# Patient Record
Sex: Male | Born: 1970 | Race: White | Hispanic: No | Marital: Married | Smoking: Never smoker
Health system: Southern US, Community
[De-identification: ages and names within clinical notes are randomized; demographics above are authoritative.]

---

## 2008-04-19 ENCOUNTER — Ambulatory Visit: Payer: Self-pay | Admitting: Family Medicine

## 2008-04-19 DIAGNOSIS — M25819 Other specified joint disorders, unspecified shoulder: Secondary | ICD-10-CM | POA: Insufficient documentation

## 2008-04-19 DIAGNOSIS — M7580 Other shoulder lesions, unspecified shoulder: Secondary | ICD-10-CM

## 2008-04-20 LAB — CONVERTED CEMR LAB
LDL Cholesterol: 136 mg/dL — ABNORMAL HIGH (ref 0–99)
Total CHOL/HDL Ratio: 6.4
Triglycerides: 153 mg/dL — ABNORMAL HIGH (ref 0–149)

## 2009-08-03 ENCOUNTER — Ambulatory Visit: Payer: Self-pay | Admitting: Family Medicine

## 2009-08-03 DIAGNOSIS — J069 Acute upper respiratory infection, unspecified: Secondary | ICD-10-CM | POA: Insufficient documentation

## 2009-09-13 ENCOUNTER — Encounter: Payer: Self-pay | Admitting: Family Medicine

## 2009-09-13 ENCOUNTER — Ambulatory Visit: Payer: Self-pay | Admitting: Family Medicine

## 2009-09-13 DIAGNOSIS — J020 Streptococcal pharyngitis: Secondary | ICD-10-CM | POA: Insufficient documentation

## 2010-04-27 ENCOUNTER — Ambulatory Visit: Payer: Self-pay | Admitting: Family Medicine

## 2010-04-27 DIAGNOSIS — H6980 Other specified disorders of Eustachian tube, unspecified ear: Secondary | ICD-10-CM | POA: Insufficient documentation

## 2010-08-29 NOTE — Consult Note (Signed)
Summary: [facility]  [facility]   Imported By: Lanelle Bal 10/05/2009 08:14:11  _____________________________________________________________________  External Attachment:    Type:   Image     Comment:   External Document

## 2010-08-29 NOTE — Assessment & Plan Note (Signed)
Summary: SORE THROAT   Vital Signs:  Patient profile:   40 year old male Height:      73 inches Weight:      192.2 pounds BMI:     25.45 Temp:     98.5 degrees F oral Pulse rate:   80 / minute Pulse rhythm:   regular BP sitting:   110 / 80  (left arm) Cuff size:   large  Vitals Entered By: Benny Lennert CMA Duncan Dull) (September 13, 2009 10:19 AM)  History of Present Illness: Chief complaint patient here for sore throat seen at urgent care on saturday and diagnosed with strep given Penicillin left side of throat is better but right side seems to be worse.  Continuing fever, worsening pain, difficulty swallowing. no difficulty breathing.   Acute Visit History:      The patient complains of earache, fever, and sore throat.  These symptoms began 5 days ago.  He denies cough, nasal discharge, and sinus problems.  Other comments include: Seen at urgent Care.Marland Kitchendx with strep..pos test. Given penicillin VK two times a day ..on 4th day dose Minimal improvement.  Right thoat worse now, fever continuing.  Using motrin for pain.        His highest temperature has been 101.  This temperature was recorded last evening.        The earache is located on the right side.        Problems Prior to Update: 1)  ? of Abscess, Peritonsillar, Right  (ICD-475) 2)  Pharyngitis, Streptococcal  (ICD-034.0) 3)  Uri  (ICD-465.9) 4)  Shoulder Impingement Syndrome, Right  (ICD-726.2) 5)  Screening For Lipoid Disorders  (ICD-V77.91) 6)  Family History of Alcoholism/addiction  (ICD-V61.41)  Current Medications (verified): 1)  None  Allergies (verified): No Known Drug Allergies  Past History:  Past medical, surgical, family and social histories (including risk factors) reviewed, and no changes noted (except as noted below).  Past Medical History: Reviewed history from 04/19/2008 and no changes required. none  Past Surgical History: Reviewed history from 04/19/2008 and no changes  required. none  Family History: Reviewed history from 04/19/2008 and no changes required. Family History Hypertension Parents, CAD Family History of Alcoholism/Addiction GP, Colon CA  Father, 54 onset of CABGx4, CAD, HTN, lipids Gradfather, 72, d/c MI Mom, melanoma of eye  Social History: Reviewed history from 04/19/2008 and no changes required. 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 CV:  Denies chest pain or discomfort and swelling of feet. Resp:  Denies shortness of breath. GI:  Denies abdominal pain. GU:  Denies dysuria.  Physical Exam  General:  Well-developed,well-nourished,in no acute distress; alert,appropriate and cooperative throughout examination Ears:  clear fluid B TMS Nose:  External nasal examination shows no deformity or inflammation. Nasal mucosa are pink and moist without lesions or exudates. Mouth:  good dentition, pharyngeal erythema, and pharyngeal exudate on right, also swelling uneual..right tonsil protruding toward uvula, uvula central. Airway clear, non obstructed.    Impression & Recommendations:  Problem # 1:  PHARYNGITIS, STREPTOCOCCAL (ICD-034.0) Continued worseing of symtpoms and fever on penicillin. Will give IM rocephin and add clindamycin to broaden antibiotics to cover anerobes. Will refer to ENT ASAP for assessment for need of drainage vs watchful waiting. Pt in agreement.  The following medications were removed from the medication list:    Zithromax Z-pak 250 Mg Tabs (Azithromycin) ..... Use  as directed His updated medication list for this problem includes:    Clindamycin Hcl 300 Mg Caps (Clindamycin hcl) .Marland Kitchen... 1 tab by mouth three times a day x 10 days  Orders: ENT Referral (ENT) Rocephin  250mg  (U7253)  Problem # 2:  ? of ABSCESS, PERITONSILLAR, RIGHT (ICD-475) Concern for this as stated above.  No airway  obstruction.Verlon Monks instructed if SOB.Marland Kitchento go to ER.  Orders: ENT Referral (ENT) Admin of Therapeutic Inj  intramuscular or subcutaneous (66440)  Complete Medication List: 1)  Clindamycin Hcl 300 Mg Caps (Clindamycin hcl) .Marland Kitchen.. 1 tab by mouth three times a day x 10 days  Patient Instructions: 1)  Referral Appointment Information 2)  Day/Date: 3)  Time: 4)  Place/MD: 5)  Address: 6)  Phone/Fax: 7)  Patient given appointment information. Information/Orders faxed/mailed.  8)  GO to ER if SOB.  Prescriptions: CLINDAMYCIN HCL 300 MG CAPS (CLINDAMYCIN HCL) 1 tab by mouth three times a day x 10 days  #30 x 0   Entered and Authorized by:   Kerby Nora MD   Signed by:   Kerby Nora MD on 09/13/2009   Method used:   [id number]       [address]       [place], [place]  [address]       Ph: [phone] or [phone]       Fax: [phone]   RxID:   [id number]   Current Allergies (reviewed today): No known allergies       Medication Administration  Injection # 1:    Medication: Rocephin  250mg     Diagnosis: PHARYNGITIS, STREPTOCOCCAL (ICD-034.0)    Route: IM    Site: RUOQ gluteus    Exp Date: 10-2011    Lot #: [id number]    Mfr: Novartis    Comments: 1 g IM x 1     Patient tolerated injection without complications    Given by: Benny Lennert CMA Duncan Dull) (September 13, 2009 11:12 AM)  Orders Added: 1)  Est. Patient Level IV [15176] 2)  ENT Referral [ENT] 3)  Rocephin  250mg  [J0696] 4)  Admin of Therapeutic Inj  intramuscular or subcutaneous [96372]   Medication Administration  Injection # 1:    Medication: Rocephin  250mg     Diagnosis: PHARYNGITIS, STREPTOCOCCAL (ICD-034.0)    Route: IM    Site: RUOQ gluteus    Exp Date: 10-2011    Lot #: [id number]    Mfr: Novartis    Comments: 1 g IM x 1     Patient tolerated injection without complications    Given by: Benny Lennert CMA Duncan Dull) (September 13, 2009 11:12  AM)  Orders Added: 1)  Est. Patient Level IV [10626] 2)  ENT Referral [ENT] 3)  Rocephin  250mg  [J0696] 4)  Admin of Therapeutic Inj  intramuscular or subcutaneous [94854]

## 2010-08-29 NOTE — Assessment & Plan Note (Signed)
Summary: EAR/CLE   Vital Signs:  Patient profile:   40 year old male Height:      73 inches Weight:      205.75 pounds BMI:     27.24 Temp:     98.2 degrees F oral Pulse rate:   92 / minute Pulse rhythm:   regular BP sitting:   122 / 70  (left arm) Cuff size:   large  Vitals Entered By: Delilah Shan CMA Duncan Dull) (April 27, 2010 3:51 PM) CC: Ear   History of Present Illness: R ear always feels "infected".  Often with pressure and pain in R ear.  Dec in hearing in R ear today.  H/o R ear infections.  No FCNAV.  No rhinorrhea, no cough.  No tinnitus.   No h/o ear surgery.    Allergies: No Known Drug Allergies  Review of Systems       See HPI.  Otherwise negative.    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  Still with intact air>bone hearing and no indication for antibiotics now.  I would use samples of veramyst in meantime and call back if not improved so we can set him up with [facility].  He understood.   Patient Instructions: 1)  use the veramyst samples- 2 sprays in each nostril once a day (total of 4 sprays a day).  Use it for 2 weeks and let me know if your aren't getting better.  Call me and we can set up an ENT visit.   Prior Medications (reviewed today): None Current Allergies (reviewed today): No known allergies

## 2010-08-29 NOTE — Assessment & Plan Note (Signed)
Summary: sinus infection   Vital Signs:  Patient profile:   40 year old male Height:      73 inches Weight:      200.25 pounds BMI:     26.52 Temp:     97.9 degrees F oral Pulse rate:   80 / minute Pulse rhythm:   regular BP sitting:   110 / 78  (left arm) Cuff size:   regular  Vitals Entered By: Lewanda Rife LPN (August 03, 2009 11:33 AM)  CC:  sinus infection, sinus drainage, head congested, and h/a and both earsache.  History of Present Illness: Here for head congestion--onset x 2wks--fever at onset, some worse now than 4-5d ago --nothing helping --taking mucinex, dayquil and nyquil --27 mo old was sick several wks ago--just prior to onset  Allergies (verified): No Known Drug Allergies  Review of Systems      See HPI  Physical Exam  General:  alert, well-developed, well-nourished, and well-hydrated.  NAD Ears:  TMs retracted woth increased fluids bilat Nose:  no mucosal edema, no airflow obstruction, and mucosal erythema.  sinuses +,- Mouth:  no exudates and pharyngeal erythema.   Lungs:  moist couth, no crackles and no wheezes.   Neurologic:  alert & oriented X3, sensation intact to light touch, and gait normal.   Cervical Nodes:  no anterior cervical adenopathy and no posterior cervical adenopathy.   Psych:  normally interactive, flat affect, and subdued.     Impression & Recommendations:  Problem # 1:  URI (ICD-465.9) Assessment New continue comfort care measures: increase po fluids, rest, tylenol or IBP as needed will start on zpac see back in 7-10d if not improved His updated medication list for this problem includes:    Mucinex D 60-600 Mg Xr12h-tab (Pseudoephedrine-guaifenesin) ..... Otc as directed.    Nyquil 60-7.12-26-998 Mg/11ml Liqd (Pseudoeph-doxylamine-dm-apap) ..... Otc as directed.    Dayquil Multi-symptom 30-325-10 Mg/36ml Liqd (Pseudoephedrine-apap-dm) ..... Otc as directed.  Complete Medication List: 1)  Mucinex D 60-600 Mg Xr12h-tab  (Pseudoephedrine-guaifenesin) .... Otc as directed. 2)  Nyquil 60-7.12-26-998 Mg/18ml Liqd (Pseudoeph-doxylamine-dm-apap) .... Otc as directed. 3)  Dayquil Multi-symptom 30-325-10 Mg/17ml Liqd (Pseudoephedrine-apap-dm) .... Otc as directed. 4)  Zithromax Z-pak 250 Mg Tabs (Azithromycin) .... Use as directed Prescriptions: ZITHROMAX Z-PAK 250 MG TABS (AZITHROMYCIN) use as directed  #1 x 0   Entered and Authorized by:   Gildardo Griffes FNP   Signed by:   Gildardo Griffes FNP on 08/03/2009   Method used:   [facility]        [facility]       [address]       [place], [place]  [address]       Ph: [phone] or [phone]       Fax: [phone]   RxID:   [id number]   Current Allergies (reviewed today): No known allergies

## 2013-05-04 ENCOUNTER — Other Ambulatory Visit: Payer: Self-pay | Admitting: Family Medicine

## 2013-05-04 DIAGNOSIS — Z13220 Encounter for screening for lipoid disorders: Secondary | ICD-10-CM

## 2013-05-04 DIAGNOSIS — Z131 Encounter for screening for diabetes mellitus: Secondary | ICD-10-CM

## 2013-05-07 ENCOUNTER — Other Ambulatory Visit (INDEPENDENT_AMBULATORY_CARE_PROVIDER_SITE_OTHER): Payer: 59

## 2013-05-07 DIAGNOSIS — Z13220 Encounter for screening for lipoid disorders: Secondary | ICD-10-CM

## 2013-05-07 DIAGNOSIS — Z131 Encounter for screening for diabetes mellitus: Secondary | ICD-10-CM

## 2013-05-07 DIAGNOSIS — R7989 Other specified abnormal findings of blood chemistry: Secondary | ICD-10-CM

## 2013-05-07 LAB — BASIC METABOLIC PANEL
BUN: 13 mg/dL (ref 6–23)
GFR: 60.83 mL/min (ref >60.00)
Glucose, Bld: 94 mg/dL (ref 70–99)
Potassium: 4.2 mEq/L (ref 3.5–5.1)

## 2013-05-07 LAB — LIPID PANEL: HDL: 33.9 mg/dL — ABNORMAL LOW (ref >39.00)

## 2013-05-13 ENCOUNTER — Encounter: Payer: Self-pay | Admitting: Family Medicine

## 2013-05-13 ENCOUNTER — Ambulatory Visit (INDEPENDENT_AMBULATORY_CARE_PROVIDER_SITE_OTHER): Payer: 59 | Admitting: Family Medicine

## 2013-05-13 VITALS — BP 110/80 | HR 92 | Temp 98.1°F | Ht 73.5 in | Wt 211.2 lb

## 2013-05-13 DIAGNOSIS — Z0000 Encounter for general adult medical examination without abnormal findings: Secondary | ICD-10-CM

## 2013-05-13 DIAGNOSIS — Z23 Encounter for immunization: Secondary | ICD-10-CM

## 2013-05-13 DIAGNOSIS — Z800 Family history of malignant neoplasm of digestive organs: Secondary | ICD-10-CM

## 2013-05-13 DIAGNOSIS — Z1211 Encounter for screening for malignant neoplasm of colon: Secondary | ICD-10-CM

## 2013-05-13 NOTE — Progress Notes (Signed)
Date:  05/13/2013   Name:  Ethan Jones   DOB:  [birth year]   MRN:  [id number] Gender: male Age: 42 y.o.  Primary Physician:  Hannah Beat, MD   Chief Complaint: Annual Exam   History of Present Illness:  Ethan Jones is a 42 y.o. pleasant patient who presents with the following:  CPX:  Grandmother has colon cancer Sister when 62 and found polyps on colon - adenomatous polyps  Has a 41 year old and had a varicocoele surgery earlier in the year.   Preventative Health Maintenance Visit:  Health Maintenance Summary Reviewed and updated, unless pt declines services.  Tobacco History Reviewed. Alcohol: No concerns, no excessive use Exercise Habits: Some activity, rec at least 30 mins 5 times a week STD concerns: no risk or activity to increase risk Drug Use: None Encouraged self-testicular check  Health Maintenance  Topic Date Due  . Tetanus/tdap  03/26/1991  . Influenza Vaccine  02/27/2014    Labs reviewed with the patient.  Results for orders placed in visit on 05/07/13  LIPID PANEL      Result Value Range   Cholesterol 181  0 - 200 mg/dL   Triglycerides 409.8 (*) 0.0 - 149.0 mg/dL   HDL 11.91 (*) >47.82 mg/dL   VLDL 95.6 (*) 0.0 - 21.3 mg/dL   Total CHOL/HDL Ratio 5    BASIC METABOLIC PANEL      Result Value Range   Sodium 143  135 - 145 mEq/L   Potassium 4.2  3.5 - 5.1 mEq/L   Chloride 106  96 - 112 mEq/L   CO2 30  19 - 32 mEq/L   Glucose, Bld 94  70 - 99 mg/dL   BUN 13  6 - 23 mg/dL   Creatinine, Ser 1.4  0.4 - 1.5 mg/dL   Calcium 9.3  8.4 - 08.6 mg/dL   GFR 57.84  >69.62 mL/min  LDL CHOLESTEROL, DIRECT      Result Value Range   Direct LDL 112.4       Patient Active Problem List   Diagnosis Date Noted  . 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  No past surgical history on file.  History   Social History  . Marital Status: Married    Spouse Name: N/A    Number of Children: N/A  . Years of  Education: N/A   Occupational History  . Not on file.   Social History Main Topics  . Smoking status: Never Smoker   . Smokeless tobacco: Never Used  . Alcohol Use: No  . Drug Use: No  . Sexual Activity: Not on file   Other Topics Concern  . Not on file   Social History Narrative  . No narrative on file    No family history on file.  No Known Allergies  Medication list has been reviewed and updated.  No outpatient prescriptions prior to visit.   No facility-administered medications prior to visit.    Review of Systems:   General: Denies fever, chills, sweats. No significant weight loss. Eyes: Denies blurring,significant itching ENT: Denies earache, sore throat, and hoarseness. Cardiovascular: Denies chest pains, palpitations, dyspnea on exertion Respiratory: Denies cough, dyspnea at rest,wheeezing Breast: no concerns about lumps GI: Denies nausea, vomiting, diarrhea, constipation, change in bowel habits, abdominal pain, melena, hematochezia GU: Denies penile discharge, ED, urinary flow / outflow problems. No STD concerns. - VARICOCOELE SURGERY Musculoskeletal: Denies back pain, joint pain Derm: Denies rash,  itching Neuro: Denies  paresthesias, frequent falls, frequent headaches Psych: Denies depression, anxiety Endocrine: Denies cold intolerance, heat intolerance, polydipsia Heme: Denies enlarged lymph nodes Allergy: No hayfever   Physical Examination: BP 110/80  Pulse 92  Temp(Src) 98.1 F (36.7 C) (Oral)  Ht 6' 1.5" (1.867 m)  Wt 211 lb 4 oz (95.822 kg)  BMI 27.49 kg/m2  Ideal Body Weight: Weight in (lb) to have BMI = 25: 191.7  GEN: well developed, well nourished, no acute distress Eyes: conjunctiva and lids normal, PERRLA, EOMI ENT: TM clear, nares clear, oral exam WNL Neck: supple, no lymphadenopathy, no thyromegaly, no JVD Pulm: clear to auscultation and percussion, respiratory effort normal CV: regular rate and rhythm, S1-S2, no murmur, rub or  gallop, no bruits, peripheral pulses normal and symmetric, no cyanosis, clubbing, edema or varicosities GI: soft, non-tender; no hepatosplenomegaly, masses; active bowel sounds all quadrants GU: no hernia, testicular mass, penile discharge Lymph: no cervical, axillary or inguinal adenopathy MSK: gait normal, muscle tone and strength WNL, no joint swelling, effusions, discoloration, crepitus  SKIN: clear, good turgor, color WNL, no rashes, lesions, or ulcerations Neuro: normal mental status, normal strength, sensation, and motion Psych: alert; oriented to person, place and time, normally interactive and not anxious or depressed in appearance.   Assessment and Plan:  Routine general medical examination at a health care facility  Family history of colon cancer requiring screening colonoscopy - Plan: Ambulatory referral to Gastroenterology  Special screening for malignant neoplasms, colon - Plan: Ambulatory referral to Gastroenterology  Need for prophylactic vaccination with combined diphtheria-tetanus-pertussis (DTP) vaccine - Plan: Tdap vaccine greater than or equal to 7yo IM  The patient's preventative maintenance and recommended screening tests for an annual wellness exam were reviewed in full today. Brought up to date unless services declined.  Counselled on the importance of diet, exercise, and its role in overall health and mortality. The patient's FH and SH was reviewed, including their home life, tobacco status, and drug and alcohol status.   The patient's sister had a high-grade adenomatous polyp at age of 60, and he also has a grandmother on the maternal side who had colon cancer. Following the recommendations from the [facility] of gastroenterology, I have referred him for colonoscopy given his increased risk.  Orders Today:  Orders Placed This Encounter  Procedures  . Tdap vaccine greater than or equal to 7yo IM  . Ambulatory referral to Gastroenterology    Referral  Priority:  Routine    Referral Type:  Consultation    Referral Reason:  Specialty Services Required    Requested Specialty:  Gastroenterology    Number of Visits Requested:  1    Updated Medication List: (Includes new medications, updates to list, dose adjustments) No orders of the defined types were placed in this encounter.    Medications Discontinued: There are no discontinued medications.    Signed,  Elpidio Galea. Rozalyn Osland, MD, CAQ Sports Medicine  [facility] [address] [place] [address] Phone: [phone] Fax: [phone]

## 2013-05-13 NOTE — Patient Instructions (Signed)
REFERRAL: GO THE THE FRONT ROOM AT THE ENTRANCE OF OUR CLINIC, NEAR CHECK IN. ASK FOR Ethan Jones. SHE WILL HELP YOU SET UP YOUR REFERRAL. DATE: TIME:  

## 2013-06-08 ENCOUNTER — Encounter: Payer: Self-pay | Admitting: Internal Medicine

## 2013-07-07 ENCOUNTER — Ambulatory Visit: Payer: 59 | Admitting: Internal Medicine

## 2013-08-06 ENCOUNTER — Telehealth: Payer: Self-pay

## 2013-08-06 MED ORDER — OSELTAMIVIR PHOSPHATE 75 MG PO CAPS: 75.0000 mg | ORAL_CAPSULE | Freq: Every day | ORAL | Status: DC

## 2013-08-06 NOTE — Telephone Encounter (Signed)
Left message on Jennifer's cell phone, that prescription for Ines BloomerShawn [facility] [place]

## 2013-08-06 NOTE — Telephone Encounter (Signed)
Tamiflu 75 mg, 1 po daily, #10, 0 refills

## 2013-08-06 NOTE — Telephone Encounter (Signed)
pts son was diagnosed with Flu A this afternoon and pt request preventative rx of tamiflu to [facility] Ethan Jones. Pt has no symptoms at this time. Pts son developed fever last night. Please advise. Pt request cb. 

## 2013-12-24 ENCOUNTER — Telehealth: Payer: Self-pay | Admitting: Family Medicine

## 2013-12-24 NOTE — Telephone Encounter (Signed)
Pt's wife dropped off Medical eval of Social Services form for completion.  She will be returning on 12/29/13 and can pick them up if completed. [phone]

## 2013-12-28 NOTE — Telephone Encounter (Signed)
done

## 2013-12-28 NOTE — Telephone Encounter (Signed)
Form completed and placed up front to be picked up.  Left message for Ethan Jones that form was ready.

## 2013-12-29 DIAGNOSIS — Z0279 Encounter for issue of other medical certificate: Secondary | ICD-10-CM

## 2014-02-12 ENCOUNTER — Ambulatory Visit (INDEPENDENT_AMBULATORY_CARE_PROVIDER_SITE_OTHER): Payer: 59 | Admitting: Family Medicine

## 2014-02-12 ENCOUNTER — Encounter: Payer: Self-pay | Admitting: Family Medicine

## 2014-02-12 VITALS — BP 96/70 | HR 75 | Temp 98.0°F | Ht 73.5 in | Wt 205.5 lb

## 2014-02-12 DIAGNOSIS — L739 Follicular disorder, unspecified: Secondary | ICD-10-CM | POA: Insufficient documentation

## 2014-02-12 DIAGNOSIS — L738 Other specified follicular disorders: Secondary | ICD-10-CM

## 2014-02-12 MED ORDER — DOXYCYCLINE HYCLATE 100 MG PO TABS: 100.0000 mg | ORAL_TABLET | Freq: Two times a day (BID) | ORAL | Status: DC

## 2014-02-12 NOTE — Patient Instructions (Signed)
Start antibiotic for 14 days. Wear sunscreen when out in sun. Start antibacterial soap on skin and edge of scalp.  Call if rash returns after being off antibiotics.

## 2014-02-12 NOTE — Progress Notes (Signed)
   Subjective:    Patient ID: Ethan Jones, male    DOB: September 15, 1970, 43 y.o.   MRN: [id number]  HPI  43 year old male pt of Dr. Patsy Lageropland presents with new onset multiple lesions on scalp in last 4 months. 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 No treatment.  The rash has spread since then, covers entire scalp. No other locations. Lesions are painful, pustules. Not itchy.  He notes they come and go as new ones pop up.  No fever.  He has been using head and shoulders shampoo without benefit. Has not applied anything else.  No hx of exposure or personal MRSA or boils.    Review of Systems  Constitutional: Negative for fever and fatigue.  HENT: Negative for ear pain.   Eyes: Negative for pain.  Respiratory: Negative for shortness of breath.   Cardiovascular: Negative for chest pain.       Objective:   Physical Exam  Constitutional: He appears well-developed and well-nourished.  HENT:  Head: Normocephalic.  Mouth/Throat: Oropharynx is clear and moist.  Eyes: Conjunctivae are normal. Pupils are equal, round, and reactive to light.  Neck: Normal range of motion. Neck supple.  Cardiovascular: Normal rate and regular rhythm.   Pulmonary/Chest: Effort normal and breath sounds normal. He has no wheezes.  Skin:  Pustules on scalp mainly at lower neck where hair is cut short          Assessment & Plan:

## 2014-02-12 NOTE — Assessment & Plan Note (Signed)
No large pustular lesions to culture. Treat with antibiotics.. Amy need [prolongued course.

## 2014-02-12 NOTE — Progress Notes (Signed)
Pre visit review using our clinic review tool, if applicable. No additional management support is needed unless otherwise documented below in the visit note. 

## 2014-04-12 ENCOUNTER — Ambulatory Visit: Payer: Self-pay

## 2014-06-16 ENCOUNTER — Encounter: Payer: Self-pay | Admitting: Family Medicine

## 2014-06-16 ENCOUNTER — Ambulatory Visit (INDEPENDENT_AMBULATORY_CARE_PROVIDER_SITE_OTHER): Payer: BC Managed Care – PPO | Admitting: Family Medicine

## 2014-06-16 VITALS — BP 118/78 | HR 127 | Temp 99.5°F | Wt 202.2 lb

## 2014-06-16 DIAGNOSIS — R509 Fever, unspecified: Secondary | ICD-10-CM

## 2014-06-16 LAB — POCT INFLUENZA A/B
INFLUENZA A, POC: NEGATIVE
INFLUENZA B, POC: NEGATIVE

## 2014-06-16 MED ORDER — ONDANSETRON 4 MG PO TBDP
4.0000 mg | ORAL_TABLET | Freq: Once | ORAL | Status: AC
  Administered 2014-06-16: 4 mg via ORAL

## 2014-06-16 MED ORDER — ONDANSETRON HCL 4 MG PO TABS: 4.0000 mg | ORAL_TABLET | Freq: Three times a day (TID) | ORAL | Status: DC | PRN

## 2014-06-16 MED ORDER — OSELTAMIVIR PHOSPHATE 75 MG PO CAPS: 75.0000 mg | ORAL_CAPSULE | Freq: Two times a day (BID) | ORAL | Status: AC

## 2014-06-16 NOTE — Progress Notes (Signed)
SUBJECTIVE:  Ethan Jones is a 43 y.o. male who present complaining of flu-like symptoms: fevers, chills, myalgias, congestion, sore throat and vomiting for 1 day. Denies dyspnea or wheezing.  Did not receive his flu shot yet this year.  + sick contacts- two new foster kids in their home, recently with URI symptoms.  No current outpatient prescriptions on file prior to visit.   No current facility-administered medications on file prior to visit.    No Known Allergies  No past medical history on file.  No past surgical history on file.  No family history on file.  History   Social History  . Marital Status: Married    Spouse Name: N/A    Number of Children: N/A  . Years of Education: N/A   Occupational History  . Not on file.   Social History Main Topics  . Smoking status: Never Smoker   . Smokeless tobacco: Never Used  . Alcohol Use: No  . Drug Use: No  . Sexual Activity: Not on file   Other Topics Concern  . Not on file   Social History Narrative   The PMH, PSH, Social History, Family History, Medications, and allergies have been [facility], and have been updated if relevant.   OBJECTIVE:  BP 118/78 mmHg  Pulse 127  Temp(Src) 99.5 F (37.5 C) (Oral)  Wt 202 lb 4 oz (91.74 kg)  SpO2 99%  Appears moderately ill but not toxic; temperature as noted in vitals. Ears normal. Throat and pharynx normal.  Neck supple. No adenopathy in the neck. Sinuses non tender. The chest is clear.  ASSESSMENT: Influenza Like Ilness  PLAN: Rapid flu neg but symptoms classic for ILI.  Discussed with pt- we agreed Tamiflu is appropriate in this case.  eRx sent for tamiflu and zofran prn nausea. Symptomatic therapy suggested: call prn if symptoms persist or worsen. Call or return to clinic prn if these symptoms worsen or fail to improve as anticipated.

## 2014-06-16 NOTE — Progress Notes (Signed)
Pre visit review using our clinic review tool, if applicable. No additional management support is needed unless otherwise documented below in the visit note. 

## 2014-06-17 ENCOUNTER — Emergency Department: Payer: Self-pay | Admitting: Emergency Medicine

## 2014-06-17 LAB — URINALYSIS, COMPLETE
Bilirubin,UR: NEGATIVE
Blood: NEGATIVE
Glucose,UR: NEGATIVE mg/dL (ref 0–75)
Leukocyte Esterase: NEGATIVE
Nitrite: NEGATIVE
Ph: 5 (ref 4.5–8.0)
Protein: 100
RBC,UR: 27 /HPF (ref 0–5)
Specific Gravity: 1.034 (ref 1.003–1.030)
WBC UR: 4 /HPF (ref 0–5)

## 2014-06-17 LAB — COMPREHENSIVE METABOLIC PANEL
ALT: 60 U/L
AST: 77 U/L — AB (ref 15–37)
Albumin: 3.7 g/dL (ref 3.4–5.0)
Alkaline Phosphatase: 84 U/L
Anion Gap: 9 (ref 7–16)
BUN: 22 mg/dL — AB (ref 7–18)
Bilirubin,Total: 0.9 mg/dL (ref 0.2–1.0)
Calcium, Total: 8.5 mg/dL (ref 8.5–10.1)
Chloride: 105 mmol/L (ref 98–107)
Co2: 24 mmol/L (ref 21–32)
Creatinine: 1.48 mg/dL — ABNORMAL HIGH (ref 0.60–1.30)
GFR CALC NON AF AMER: 55 — AB
Glucose: 106 mg/dL — ABNORMAL HIGH (ref 65–99)
Osmolality: 279 (ref 275–301)
Potassium: 3.6 mmol/L (ref 3.5–5.1)
Sodium: 138 mmol/L (ref 136–145)
Total Protein: 7.5 g/dL (ref 6.4–8.2)

## 2014-06-17 LAB — CBC
HCT: 50.2 % (ref 40.0–52.0)
HGB: 16.9 g/dL (ref 13.0–18.0)
MCH: 28.9 pg (ref 26.0–34.0)
MCHC: 33.6 g/dL (ref 32.0–36.0)
MCV: 86 fL (ref 80–100)
Platelet: 137 10*3/uL — ABNORMAL LOW (ref 150–440)
RBC: 5.84 10*6/uL (ref 4.40–5.90)
RDW: 13.1 % (ref 11.5–14.5)
WBC: 7.1 10*3/uL (ref 3.8–10.6)

## 2014-06-17 LAB — INFLUENZA A,B,H1N1 - PCR (ARMC)
H1N1 flu by pcr: NOT DETECTED
Influenza A By PCR: NEGATIVE
Influenza B By PCR: NEGATIVE

## 2014-06-17 LAB — LIPASE, BLOOD: Lipase: 151 U/L (ref 73–393)

## 2014-06-18 LAB — URINALYSIS, COMPLETE
Bacteria: NONE SEEN
Bilirubin,UR: NEGATIVE
Glucose,UR: NEGATIVE mg/dL (ref 0–75)
LEUKOCYTE ESTERASE: NEGATIVE
NITRITE: NEGATIVE
Ph: 6 (ref 4.5–8.0)
RBC,UR: 16 /HPF (ref 0–5)
SQUAMOUS EPITHELIAL: NONE SEEN
Specific Gravity: 1.023 (ref 1.003–1.030)

## 2014-06-19 ENCOUNTER — Observation Stay: Payer: Self-pay | Admitting: Internal Medicine

## 2014-06-19 LAB — URINALYSIS, COMPLETE
Bacteria: NONE SEEN
Bacteria: NONE SEEN
Bilirubin,UR: NEGATIVE
Bilirubin,UR: NEGATIVE
GLUCOSE, UR: NEGATIVE mg/dL (ref 0–75)
Glucose,UR: NEGATIVE mg/dL (ref 0–75)
Ketone: NEGATIVE
Ketone: NEGATIVE
Leukocyte Esterase: NEGATIVE
Leukocyte Esterase: NEGATIVE
Nitrite: NEGATIVE
Nitrite: NEGATIVE
PROTEIN: NEGATIVE
Ph: 6 (ref 4.5–8.0)
Ph: 7 (ref 4.5–8.0)
Protein: NEGATIVE
RBC,UR: 27 /HPF (ref 0–5)
Specific Gravity: 1.011 (ref 1.003–1.030)
Specific Gravity: 1.008 (ref 1.003–1.030)
Squamous Epithelial: 1
Squamous Epithelial: NONE SEEN
WBC UR: 3 /HPF (ref 0–5)

## 2014-06-19 LAB — COMPREHENSIVE METABOLIC PANEL
ALT: 164 U/L — AB
Albumin: 2.8 g/dL — ABNORMAL LOW (ref 3.4–5.0)
Alkaline Phosphatase: 82 U/L
Anion Gap: 5 — ABNORMAL LOW (ref 7–16)
BUN: 13 mg/dL (ref 7–18)
Bilirubin,Total: 0.7 mg/dL (ref 0.2–1.0)
CHLORIDE: 106 mmol/L (ref 98–107)
CREATININE: 1.2 mg/dL (ref 0.60–1.30)
Calcium, Total: 7.7 mg/dL — ABNORMAL LOW (ref 8.5–10.1)
Co2: 29 mmol/L (ref 21–32)
EGFR (African American): >60
GLUCOSE: 89 mg/dL (ref 65–99)
Osmolality: 279 (ref 275–301)
Potassium: 3.3 mmol/L — ABNORMAL LOW (ref 3.5–5.1)
SGOT(AST): 117 U/L — ABNORMAL HIGH (ref 15–37)
Sodium: 140 mmol/L (ref 136–145)
Total Protein: 6 g/dL — ABNORMAL LOW (ref 6.4–8.2)

## 2014-06-19 LAB — CBC WITH DIFFERENTIAL/PLATELET
BASOS ABS: 0 10*3/uL (ref 0.0–0.1)
Basophil %: 0.6 %
Eosinophil #: 0.1 10*3/uL (ref 0.0–0.7)
Eosinophil %: 1.5 %
HCT: 42.4 % (ref 40.0–52.0)
HGB: 14.4 g/dL (ref 13.0–18.0)
LYMPHS ABS: 1.1 10*3/uL (ref 1.0–3.6)
LYMPHS PCT: 23.8 %
MCH: 29 pg (ref 26.0–34.0)
MCHC: 34 g/dL (ref 32.0–36.0)
MCV: 85 fL (ref 80–100)
MONO ABS: 0.7 x10 3/mm (ref 0.2–1.0)
Monocyte %: 14.3 %
NEUTROS PCT: 59.8 %
Neutrophil #: 2.8 10*3/uL (ref 1.4–6.5)
Platelet: 148 10*3/uL — ABNORMAL LOW (ref 150–440)
RBC: 4.97 10*6/uL (ref 4.40–5.90)
RDW: 13.2 % (ref 11.5–14.5)
WBC: 4.7 10*3/uL (ref 3.8–10.6)

## 2014-06-19 LAB — CLOSTRIDIUM DIFFICILE(ARMC)

## 2014-06-19 LAB — CK: CK, Total: 70 U/L (ref 39–308)

## 2014-06-20 LAB — BASIC METABOLIC PANEL
ANION GAP: 8 (ref 7–16)
BUN: 6 mg/dL — ABNORMAL LOW (ref 7–18)
CALCIUM: 8.1 mg/dL — AB (ref 8.5–10.1)
CO2: 26 mmol/L (ref 21–32)
CREATININE: 0.94 mg/dL (ref 0.60–1.30)
Chloride: 109 mmol/L — ABNORMAL HIGH (ref 98–107)
EGFR (Non-African Amer.): >60
Glucose: 79 mg/dL (ref 65–99)
OSMOLALITY: 282 (ref 275–301)
Potassium: 3.7 mmol/L (ref 3.5–5.1)
Sodium: 143 mmol/L (ref 136–145)

## 2014-06-20 LAB — CBC WITH DIFFERENTIAL/PLATELET
BASOS PCT: 0.4 %
Basophil #: 0 10*3/uL (ref 0.0–0.1)
Eosinophil #: 0.1 10*3/uL (ref 0.0–0.7)
Eosinophil %: 2.6 %
HCT: 41.8 % (ref 40.0–52.0)
HGB: 14.2 g/dL (ref 13.0–18.0)
LYMPHS ABS: 1.2 10*3/uL (ref 1.0–3.6)
Lymphocyte %: 21.2 %
MCH: 28.8 pg (ref 26.0–34.0)
MCHC: 34 g/dL (ref 32.0–36.0)
MCV: 85 fL (ref 80–100)
MONO ABS: 0.8 x10 3/mm (ref 0.2–1.0)
Monocyte %: 13.7 %
Neutrophil #: 3.6 10*3/uL (ref 1.4–6.5)
Neutrophil %: 62.1 %
PLATELETS: 159 10*3/uL (ref 150–440)
RBC: 4.93 10*6/uL (ref 4.40–5.90)
RDW: 13.2 % (ref 11.5–14.5)
WBC: 5.8 10*3/uL (ref 3.8–10.6)

## 2014-06-20 LAB — HEPATIC FUNCTION PANEL A (ARMC)
Albumin: 2.9 g/dL — ABNORMAL LOW (ref 3.4–5.0)
Alkaline Phosphatase: 74 U/L
Bilirubin, Direct: 0.1 mg/dL (ref 0.0–0.2)
Bilirubin,Total: 0.6 mg/dL (ref 0.2–1.0)
SGOT(AST): 49 U/L — ABNORMAL HIGH (ref 15–37)
SGPT (ALT): 104 U/L — ABNORMAL HIGH
TOTAL PROTEIN: 6 g/dL — AB (ref 6.4–8.2)

## 2014-06-21 LAB — STOOL CULTURE

## 2014-07-07 ENCOUNTER — Encounter: Payer: Self-pay | Admitting: Family Medicine

## 2014-07-07 ENCOUNTER — Ambulatory Visit (INDEPENDENT_AMBULATORY_CARE_PROVIDER_SITE_OTHER): Payer: BC Managed Care – PPO | Admitting: Family Medicine

## 2014-07-07 VITALS — BP 94/70 | HR 74 | Temp 98.4°F | Ht 73.5 in | Wt 197.8 lb

## 2014-07-07 DIAGNOSIS — R7989 Other specified abnormal findings of blood chemistry: Secondary | ICD-10-CM

## 2014-07-07 DIAGNOSIS — R197 Diarrhea, unspecified: Secondary | ICD-10-CM

## 2014-07-07 DIAGNOSIS — R945 Abnormal results of liver function studies: Principal | ICD-10-CM

## 2014-07-07 DIAGNOSIS — E860 Dehydration: Secondary | ICD-10-CM

## 2014-07-07 DIAGNOSIS — A084 Viral intestinal infection, unspecified: Secondary | ICD-10-CM

## 2014-07-07 LAB — HEPATIC FUNCTION PANEL
ALT: 84 U/L — ABNORMAL HIGH (ref 0–53)
AST: 50 U/L — ABNORMAL HIGH (ref 0–37)
Albumin: 4.3 g/dL (ref 3.5–5.2)
Alkaline Phosphatase: 78 U/L (ref 39–117)
Bilirubin, Direct: 0 mg/dL (ref 0.0–0.3)
Total Bilirubin: 0.6 mg/dL (ref 0.2–1.2)
Total Protein: 7.3 g/dL (ref 6.0–8.3)

## 2014-07-07 NOTE — Progress Notes (Signed)
Pre visit review using our clinic review tool, if applicable. No additional management support is needed unless otherwise documented below in the visit note. 

## 2014-07-07 NOTE — Progress Notes (Signed)
Dr. Karleen HampshireSpencer T. Matteo Banke, MD, [facility] [address] [place], [address] Phone: [phone] Fax: [phone]  07/07/2014  Patient: Ethan Jones, MRN: [id number], DOB: 08-Jun-1971, 43 y.o.  Primary Physician:  Hannah BeatSpencer Alonnah Lampkins, MD  Chief Complaint: Follow-up  Subjective:   Ethan Jones is a 43 y.o. very pleasant male patient who presents with the following:  [facility] hospital follow[facility]up:  105 temperature. Throwing up, heart rate was really high. Then had direct diarrhea.  Thursday before diarrhea. Urinating blood - high fever, and spent 4-5 hours in the ER. Admitted on Friday night and left Sunday.   Stools are soft now, once or twice a day now.  Other than this, he is generally feeling well, and he is back to work and doing his normal activities.  The patient's liver enzymes were elevated in the hospital, but aside from this they have been trending downward, and he feels well.  Past Medical History, Surgical History, Social History, Family History, Problem List, Medications, and Allergies have been reviewed and updated if relevant.   GEN: as above GI: as above Pulm: No SOB Interactive and getting along well at home.  Otherwise, ROS is as per the HPI.  Objective:   BP 94/70 mmHg  Pulse 74  Temp(Src) 98.4 F (36.9 C) (Oral)  Ht 6' 1.5" (1.867 m)  Wt 197 lb 12 oz (89.699 kg)  BMI 25.73 kg/m2  GEN: WDWN, NAD, Non-toxic, A & O x 3 HEENT: Atraumatic, Normocephalic. Neck supple. No masses, No LAD. Ears and Nose: No external deformity. CV: RRR, No M/G/R. No JVD. No thrill. No extra heart sounds. PULM: CTA B, no wheezes, crackles, rhonchi. No retractions. No resp. distress. No accessory muscle use. EXTR: No c/c/e NEURO Normal gait.  PSYCH: Normally interactive. Conversant. Not depressed or anxious appearing.  Calm demeanor.   Laboratory and Imaging Data: Results for orders placed or performed in visit on 07/07/14  Hepatic  function panel  Result Value Ref Range   Total Bilirubin 0.6 0.2 - 1.2 mg/dL   Bilirubin, Direct 0.0 0.0 - 0.3 mg/dL   Alkaline Phosphatase 78 39 - 117 U/L   AST 50 (H) 0 - 37 U/L   ALT 84 (H) 0 - 53 U/L   Total Protein 7.3 6.0 - 8.3 g/dL   Albumin 4.3 3.5 - 5.2 g/dL    All Hospital data is reviewed.  Assessment and Plan:   Elevated LFTs - Plan: Hepatic function panel  Viral gastroenteritis  Dehydration  Diarrhea  Clinically, the patient is doing quite well, but he was very sick, with a temperature of 105, viral transaminitis, and severe dehydration requiring 8 L of fluid as well as diarrhea.  Recommended that the patient add in some probiotics over the next 1-2 weeks to help with his stools.  Check liver function.  Elevated.  Repeat in the next couple of months.  Follow-up: No Follow-up on file.  New Prescriptions   No medications on file   Orders Placed This Encounter  Procedures  . Hepatic function panel    Signed,  Karleen HampshireSpencer T. Evelynn Hench, MD   Patient's Medications  New Prescriptions   No medications on file  Previous Medications   No medications on file  Modified Medications   No medications on file  Discontinued Medications   BUTALBITAL-ACETAMINOPHEN-CAFFEINE (FIORICET, ESGIC) 50-325-40 MG PER TABLET       CIPROFLOXACIN (CIPRO) 500 MG TABLET       CLINDAMYCIN (CLEOCIN T)  1 % EXTERNAL SOLUTION       METRONIDAZOLE (FLAGYL) 500 MG TABLET       ONDANSETRON (ZOFRAN) 4 MG TABLET    Take 1 tablet (4 mg total) by mouth every 8 (eight) hours as needed for nausea or vomiting.   PROMETHAZINE (PHENERGAN) 25 MG TABLET

## 2014-07-12 ENCOUNTER — Telehealth: Payer: Self-pay | Admitting: *Deleted

## 2014-07-12 DIAGNOSIS — R945 Abnormal results of liver function studies: Principal | ICD-10-CM

## 2014-07-12 DIAGNOSIS — R7989 Other specified abnormal findings of blood chemistry: Secondary | ICD-10-CM

## 2014-07-12 NOTE — Telephone Encounter (Signed)
[name] notified that liver function tests are trending down, but not quite down to normal.  Per Dr. Patsy Lageropland will repeat LFT's in three months. Advised Ethan Jones to call back to schedule lab only appointment in three months.

## 2014-11-20 NOTE — Discharge Summary (Signed)
PATIENT NAME:  Ethan Jones, Angelo MR#:  [id number] DATE OF BIRTH:  Apr 14, 1971  ADMITTING PHYSICIAN: Kelton PillarMichael S. Sheryle Hailiamond, MD  DISCHARGING PHYSICIAN: Enid Baasadhika Sherene Plancarte, MD  PRIMARY CARE MD:  Dr. Kerin PernaSpencer Copeland  CONSULTATIONS IN THE HOSPITAL: None.   DISCHARGE DIAGNOSES: 1.  Acute gastroenteritis. Negative stool cultures.  2.  Transaminitis, likely viral etiology triggered by the gastroenteritis.  3.  Migraine headaches.   DISCHARGE HOME MEDICATIONS:  1.  Phenergan 25 mg q. 6 hours p.r.n. for nausea and vomiting.  2.  Fioricet 2 tablets every 4 hours p.r.n. for migraine headaches.  3.  Ciprofloxacin 500 mg p.o. b.i.d. for 9 days.  4.  Flagyl 500 mg p.o. q. 8 hours for 9 days.   DISCHARGE DIET: Regular diet.   DISCHARGE ACTIVITY: As tolerated.   FOLLOWUP INSTRUCTIONS: PCP followup in 1-2 weeks.   LABORATORY AND IMAGING STUDIES: Prior to discharge, stool cultures are negative for salmonella, campylobacter, Escherichia coli. Stool for C. difficile is negative. WBC 5.8, hemoglobin 14.2, hematocrit 41.8, platelet count 159,000. Sodium 143, potassium 3.7, chloride 109, bicarbonate 26, BUN 6, creatinine 0.94, glucose 79, and calcium of 8.1. ALT 104, AST 49, alkaline phosphatase 74, total bilirubin is 0.6. Urinalysis negative for any infection. Acute hepatitis panel is negative. CT of the abdomen and pelvis done in the Emergency Room showing fluid-filled bowel suggesting gastroenteritis with reactive lymph nodes in small bowel mesentery.  No evidence of obstruction or appendicitis. Solid visceral organs appear unremarkable. Mild atelectasis at both lung bases.   BRIEF HOSPITAL COURSE: Mr. Ethan Jones is a very young, healthy Caucasian male with no significant past medical history other than migraine headaches, comes to the hospital secondary to abdominal pain, nausea, vomiting, and diarrhea.  1.  Acute gastroenteritis started after eating outside food.  Did not have any fevers, but did have evidence of  dehydration on the laboratories with hypokalemia and mild acute renal failure. IV fluids have resolved the condition. CT did not show any evidence of other infection other than gastroenteritis. Stool cultures are negative. His symptoms actually improved after starting him on Cipro and Flagyl, so while his stool cultures were pending, he was discharged on the antibiotics.  His stool cultures now came back as negative.  The patient was clinically improving at the time of discharge and he was able to tolerate regular diet.  2.  Transaminitis secondary to gastroenteritis, viral etiology. Hepatitis panel is negative, improving as his symptoms were improving.   3.  Migraine headaches. Fioricet p.r.n.   His course has been otherwise uneventful in the hospital.   DISCHARGE CONDITION: Stable.   DISCHARGE DISPOSITION: Home.   TIME SPENT ON DISCHARGE: 40 minutes.    ____________________________ Enid Baasadhika Rollins Wrightson, MD rk:LT D: 06/21/2014 16:47:00 ET T: 06/21/2014 17:20:02 ET JOB#: [id number]  cc: Enid Baasadhika Kaliann Coryell, MD, <Dictator> Dr. Lane Hackeropeland Caidance Sybert MD ELECTRONICALLY SIGNED 07/16/2014 13:58

## 2014-11-20 NOTE — H&P (Signed)
PATIENT NAME:  Ethan Jones, Ethan Jones MR#:  [id number] DATE OF BIRTH:  02/26/1971  DATE OF ADMISSION:  06/19/2014  REFERRING PHYSICIAN: Enedina Finner. Manson Passey, MD  PRIMARY CARE DOCTOR: Hannah Beat, MD  ADMITTING DIAGNOSIS: Acute hepatitis.   HISTORY OF PRESENT ILLNESS: This is a 44 year old Caucasian male who presents to the Emergency Department for the second time in as many days. The patient complains of diarrhea for 2 days now. It is nonbloody. It started after his last ED visit and he spiked a temperature of 105 at home yesterday. He states that he had been vomiting for the 4 days previous to that, but he was able to eat some toast today and keep it down. The vomiting began after eating a [place] sandwich prepared at a restaurant. Nobody in his household is sick. His kids are healthy and do not attend day care at this time. He has not been hunting or skinning animals or drinking any water from streams. What brought him to the Emergency Department today was that he saw some blood in his urine this evening. The patient states the hematuria was painless. He denies any blood in his stools and has not been to any petting zoos. Laboratory evaluation revealed an increase in his liver enzymes so the Emergency Department called for admission.  REVIEW OF SYSTEMS:  CONSTITUTIONAL: The patient admits to fever and general malaise.  EYES: Denies blurred vision or inflammation or photophobia.  EARS, NOSE AND THROAT: Denies tinnitus or difficulty swallowing.  RESPIRATORY: Denies cough or shortness of breath.  CARDIOVASCULAR: Denies chest pain or palpitations.  GASTROINTESTINAL: Admits to nausea and vomiting that has now stopped, but he now has diarrhea. He did have some abdominal pain, which has been relieved after some morphine.  GENITOURINARY: Denies dysuria but admits to hematuria. ENDOCRINE: Denies polyuria, polydipsia. HEMATOLOGIC AND LYMPHATIC: Denies easy bruising or bleeding.  INTEGUMENTARY: Denies rashes or  lesions.  MUSCULOSKELETAL: Denies myalgias or arthralgias.  NEUROLOGIC: Denies numbness or dysarthria but admits to some stiffness in his neck. The patient also admits to headache for the last 2 days. The patient has had no visual changes and he does not have dizziness or lower extremity weakness.  PSYCHIATRIC: Denies depression or suicidal ideation.   PAST MEDICAL HISTORY: None.   PAST SURGICAL HISTORY: Varicocele repair.   SOCIAL HISTORY: The patient does not smoke, drink or do any drugs. He is married.   FAMILY HISTORY: The patient's father and both grandfathers are deceased from coronary artery disease. His mother is in remission for ocular melanoma.   MEDICATIONS:  1.  Fioricet 325 mg/50 mg/40 mg 2 tabs p.o. every 4 hours as needed for headache.  2.  Promethazine 25 mg 1 tablet p.o. every 6 hours as needed for nausea or vomiting.  3.  Tamiflu 75 mg 1 capsule p.o. b.i.d. for 5 days (the patient is on day 3 of 5).   ALLERGIES: No known drug allergies.  PERTINENT LABORATORY RESULTS AND RADIOGRAPHIC FINDINGS: Serum glucose is 89, BUN 13, creatinine is 1.2, sodium is 140, potassium is 3.3, chloride is 106, bicarbonate is 29, calcium is 7.7, serum albumin is 2.8. Alkaline phosphatase is 82, AST is 117, ALT is 164. Total creatine kinase is 70. White blood cell count is 4.7, hemoglobin is 14.4, hematocrit is 42.4, platelet count 148,000. Urinalysis shows 2+ blood, negative for nitrites and leukocyte esterase. There are 10 red blood cells per high-power field with an associated 3 white blood cells per high-power field. CT scan of the  abdomen and pelvis shows some fluid-filled bowel suggesting gastroenteritis with probable reactive lymph nodes in the small-bowel mesentery. There is no evidence of obstruction or appendicitis. The solid visceral organs appear unremarkable and there is some mild atelectasis at both lung bases.   PHYSICAL EXAMINATION:  VITAL SIGNS: Temperature 98.4, pulse 93,  respirations 18, blood pressure is 93/60, pulse 96% on room air.  GENERAL: The patient is alert and oriented x 3. He is in no apparent distress at this time.  HEENT: Normocephalic, atraumatic. Pupils equal, round, and reactive to light and accommodation. Extraocular movements are intact. Mucous membranes are moist. He has no photophobia.  NECK: Trachea is midline. No adenopathy.  CHEST: Symmetric, atraumatic. CARDIOVASCULAR: Tachycardic rate, normal rhythm. Normal S1, S2. No rubs, clicks, or murmurs appreciated.  LUNGS: Clear to auscultation bilaterally. Normal effort and excursion.  ABDOMEN: Positive bowel sounds. Soft, nontender, nondistended. No hepatosplenomegaly.  GENITOURINARY: Deferred.  MUSCULOSKELETAL: The patient moves all 4 extremities equally. There is 5/5 strength in upper and lower extremities bilaterally.  SKIN: No rashes or lesions. There is no jaundice.  EXTREMITIES: No clubbing, cyanosis, or edema.  NEUROLOGIC: Cranial nerves II through XII are grossly intact. The patient can touch his chin to his chest without significant pain.  PSYCHIATRIC: Mood is normal. Affect is congruent.    ASSESSMENT AND PLAN: This is a 44 year old male admitted for acute hepatitis.  1.  Hepatitis, likely viral etiology. Yesterday when the patient was in the Emergency Department only his AST was slightly elevated. Today both AST and ALT are more than tripled from their value the day before. He has had some improvement in his renal function, which reflects fluid resuscitation; thus his elevation of hepatic enzymes is unlikely due to shock or low-flow state. He has had some abdominal pain and now has a headache, but no associated respiratory symptoms, nor does he have photophobia or actual nuchal rigidity. His fever is concerning, but he is now afebrile without Tylenol. Differential diagnosis includes meningitis, but there is a low likelihood for that at this time. My current concern is perhaps for hepatitis  A. We will follow his liver function tests and observe for any signs or symptoms of fulminant liver failure. I have ordered stool cultures and an enterovirus PCR as well as hepatitis A antibodies. If the patient becomes febrile or if his headache proves to be unremitting, we should consider performing a lumbar puncture.  2.  Hematuria. It is painless. It appears to be clearing with more hydration. Differential diagnosis includes hemolytic uremia syndrome but kidney function is normal. He is asymptomatic for urinary tract infection.  3.  Thrombocytopenia. His platelet count is marginal at 148,000, which is barely under normal. The patient does not have any bleeding. We will continue to monitor.  4.  Overweight. The patient's BMI is 26.3. I have encouraged a balanced diet and exercise once he is well.  5.  Deep vein thrombosis prophylaxis. Sequential compression devices for now.  6.  Gastrointestinal prophylaxis. Unnecessary at this time as the patient is not critically ill.  CODE STATUS: The patient is a full code.  TIME SPENT ON ADMISSION ORDERS AND PATIENT CARE: Approximately 35 minutes.    ____________________________ Kelton PillarMichael S. Sheryle Hailiamond, MD msd:ST D: 06/19/2014 06:23:00 ET T: 06/19/2014 07:39:34 ET JOB#: [id number]  cc: Kelton PillarMichael S. Sheryle Hailiamond, MD, <Dictator> Kelton PillarMICHAEL S Jamese Trauger MD ELECTRONICALLY SIGNED 06/22/2014 0:35

## 2015-12-12 ENCOUNTER — Other Ambulatory Visit (INDEPENDENT_AMBULATORY_CARE_PROVIDER_SITE_OTHER): Payer: 59

## 2015-12-12 DIAGNOSIS — Z79899 Other long term (current) drug therapy: Secondary | ICD-10-CM

## 2015-12-12 DIAGNOSIS — Z114 Encounter for screening for human immunodeficiency virus [HIV]: Secondary | ICD-10-CM

## 2015-12-12 DIAGNOSIS — Z13220 Encounter for screening for lipoid disorders: Secondary | ICD-10-CM | POA: Diagnosis not present

## 2015-12-12 LAB — LIPID PANEL
CHOL/HDL RATIO: 6
Cholesterol: 181 mg/dL (ref 0–200)
HDL: 29.7 mg/dL — ABNORMAL LOW (ref >39.00)
LDL Cholesterol: 151 mg/dL — ABNORMAL HIGH (ref 0–99)
NONHDL: 151.63
Triglycerides: 1 mg/dL (ref 0.0–149.0)
VLDL: 0.2 mg/dL (ref 0.0–40.0)

## 2015-12-12 LAB — CBC WITH DIFFERENTIAL/PLATELET
BASOS ABS: 0 10*3/uL (ref 0.0–0.1)
Basophils Relative: 0.6 % (ref 0.0–3.0)
EOS ABS: 0.2 10*3/uL (ref 0.0–0.7)
EOS PCT: 3.5 % (ref 0.0–5.0)
HCT: 47.8 % (ref 39.0–52.0)
HEMOGLOBIN: 16.2 g/dL (ref 13.0–17.0)
Lymphocytes Relative: 30.6 % (ref 12.0–46.0)
Lymphs Abs: 2.1 10*3/uL (ref 0.7–4.0)
MCHC: 33.9 g/dL (ref 30.0–36.0)
MCV: 84.8 fl (ref 78.0–100.0)
MONO ABS: 0.5 10*3/uL (ref 0.1–1.0)
Monocytes Relative: 6.9 % (ref 3.0–12.0)
NEUTROS PCT: 58.4 % (ref 43.0–77.0)
Neutro Abs: 4 10*3/uL (ref 1.4–7.7)
Platelets: 201 10*3/uL (ref 150.0–400.0)
RBC: 5.63 Mil/uL (ref 4.22–5.81)
RDW: 13 % (ref 11.5–15.5)
WBC: 6.8 10*3/uL (ref 4.0–10.5)

## 2015-12-12 LAB — HEPATIC FUNCTION PANEL
ALBUMIN: 4.5 g/dL (ref 3.5–5.2)
ALK PHOS: 66 U/L (ref 39–117)
ALT: 26 U/L (ref 0–53)
AST: 22 U/L (ref 0–37)
BILIRUBIN DIRECT: 0.1 mg/dL (ref 0.0–0.3)
BILIRUBIN TOTAL: 0.7 mg/dL (ref 0.2–1.2)
Total Protein: 7 g/dL (ref 6.0–8.3)

## 2015-12-12 LAB — HIV ANTIBODY (ROUTINE TESTING W REFLEX): HIV 1&2 Ab, 4th Generation: NONREACTIVE

## 2015-12-12 LAB — BASIC METABOLIC PANEL
BUN: 17 mg/dL (ref 6–23)
CALCIUM: 10 mg/dL (ref 8.4–10.5)
CHLORIDE: 107 meq/L (ref 96–112)
CO2: 28 meq/L (ref 19–32)
Creatinine, Ser: 1.26 mg/dL (ref 0.40–1.50)
GFR: 66.17 mL/min (ref >60.00)
Glucose, Bld: 94 mg/dL (ref 70–99)
POTASSIUM: 3.9 meq/L (ref 3.5–5.1)
SODIUM: 141 meq/L (ref 135–145)

## 2015-12-19 ENCOUNTER — Ambulatory Visit (INDEPENDENT_AMBULATORY_CARE_PROVIDER_SITE_OTHER): Payer: 59 | Admitting: Family Medicine

## 2015-12-19 ENCOUNTER — Encounter: Payer: Self-pay | Admitting: Family Medicine

## 2015-12-19 VITALS — BP 104/80 | HR 108 | Temp 97.6°F | Ht 73.5 in | Wt 214.5 lb

## 2015-12-19 DIAGNOSIS — Z0000 Encounter for general adult medical examination without abnormal findings: Secondary | ICD-10-CM

## 2015-12-19 NOTE — Progress Notes (Signed)
Dr. Frederico Hamman T. Cyleigh Massaro, MD, [facility] [place], [address] Phone: [phone] Fax: [phone]  12/19/2015  Patient: Ethan Jones, MRN: [id number], DOB: 1971/03/12, 45 y.o.  Primary Physician:  Owens Loffler, MD   Chief Complaint  Patient presents with  . Annual Exam   Subjective:   Ethan Jones is a 45 y.o. pleasant patient who presents with the following:  Preventative Health Maintenance Visit:  Health Maintenance Summary Reviewed and updated, unless pt declines services.  Tobacco History Reviewed. Alcohol: No concerns, no excessive use Exercise Habits: Some activity, rec at least 30 mins 5 times a week STD concerns: no risk or activity to increase risk Drug Use: None Encouraged self-testicular check  Doing well  Health Maintenance  Topic Date Due  . INFLUENZA VACCINE  02/28/2016  . TETANUS/TDAP  05/14/2023  . HIV Screening  Completed   Immunization History  Administered Date(s) Administered  . Tdap 05/13/2013   Patient Active Problem List   Diagnosis Date Noted  . Folliculitis 10/18/2246  . 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 No past surgical history on file. Social History   Social History  . Marital Status: Married    Spouse Name: N/A  . Number of Children: N/A  . Years of Education: N/A   Occupational History  . Not on file.   Social History Main Topics  . Smoking status: Never Smoker   . Smokeless tobacco: Never Used  . Alcohol Use: No  . Drug Use: No  . Sexual Activity: Not on file   Other Topics Concern  . Not on file   Social History Narrative   No family history on file. No Known Allergies  Medication list has been reviewed and updated.   General: Denies fever, chills, sweats. No significant weight loss. Eyes: Denies blurring,significant itching ENT: Denies earache, sore throat, and hoarseness. Cardiovascular:  Denies chest pains, palpitations, dyspnea on exertion Respiratory: Denies cough, dyspnea at rest,wheeezing Breast: no concerns about lumps GI: Denies nausea, vomiting, diarrhea, constipation, change in bowel habits, abdominal pain, melena, hematochezia GU: Denies penile discharge, ED, urinary flow / outflow problems. No STD concerns. Musculoskeletal: Denies back pain, joint pain Derm: Denies rash, itching Neuro: Denies  paresthesias, frequent falls, frequent headaches Psych: Denies depression, anxiety Endocrine: Denies cold intolerance, heat intolerance, polydipsia Heme: Denies enlarged lymph nodes Allergy: No hayfever  Objective:   BP 104/80 mmHg  Pulse 108  Temp(Src) 97.6 F (36.4 C) (Oral)  Ht 6' 1.5" (1.867 m)  Wt 214 lb 8 oz (97.297 kg)  BMI 27.91 kg/m2 Ideal Body Weight: Weight in (lb) to have BMI = 25: 191.7  No exam data present  GEN: well developed, well nourished, no acute distress Eyes: conjunctiva and lids normal, PERRLA, EOMI ENT: TM clear, nares clear, oral exam WNL Neck: supple, no lymphadenopathy, no thyromegaly, no JVD Pulm: clear to auscultation and percussion, respiratory effort normal CV: regular rate and rhythm, S1-S2, no murmur, rub or gallop, no bruits, peripheral pulses normal and symmetric, no cyanosis, clubbing, edema or varicosities GI: soft, non-tender; no hepatosplenomegaly, masses; active bowel sounds all quadrants GU: no hernia, testicular mass, penile discharge Lymph: no cervical, axillary or inguinal adenopathy MSK: gait normal, muscle tone and strength WNL, no joint swelling, effusions, discoloration, crepitus  SKIN: clear, good turgor, color WNL, no rashes, lesions, or ulcerations Neuro: normal mental status, normal strength, sensation, and motion Psych: alert; oriented to person, place  and time, normally interactive and not anxious or depressed in appearance. All labs reviewed with patient.  Lipids:    Component Value Date/Time   CHOL  181 12/12/2015 0859   TRIG 1.0 12/12/2015 0859   HDL 29.70* 12/12/2015 0859   LDLDIRECT 112.4 05/07/2013 0806   VLDL 0.2 12/12/2015 0859   CHOLHDL 6 12/12/2015 0859   CBC: CBC Latest Ref Rng 12/12/2015 06/20/2014 06/18/2014  WBC 4.0 - 10.5 K/uL 6.8 5.8 4.7  Hemoglobin 13.0 - 17.0 g/dL 16.2 14.2 14.4  Hematocrit 39.0 - 52.0 % 47.8 41.8 42.4  Platelets 150.0 - 400.0 K/uL 201.0 159 148(L)    Basic Metabolic Panel:    Component Value Date/Time   NA 141 12/12/2015 0859   NA 143 06/20/2014 0401   K 3.9 12/12/2015 0859   K 3.7 06/20/2014 0401   CL 107 12/12/2015 0859   CL 109* 06/20/2014 0401   CO2 28 12/12/2015 0859   CO2 26 06/20/2014 0401   BUN 17 12/12/2015 0859   BUN 6* 06/20/2014 0401   CREATININE 1.26 12/12/2015 0859   CREATININE 0.94 06/20/2014 0401   GLUCOSE 94 12/12/2015 0859   GLUCOSE 79 06/20/2014 0401   CALCIUM 10.0 12/12/2015 0859   CALCIUM 8.1* 06/20/2014 0401   Hepatic Function Latest Ref Rng 12/12/2015 07/07/2014 06/20/2014  Total Protein 6.0 - 8.3 g/dL 7.0 7.3 6.0(L)  Albumin 3.5 - 5.2 g/dL 4.5 4.3 2.9(L)  AST 0 - 37 U/L 22 50(H) 49(H)  ALT 0 - 53 U/L 26 84(H) 104(H)  Alk Phosphatase 39 - 117 U/L 66 78 74  Total Bilirubin 0.2 - 1.2 mg/dL 0.7 0.6 0.6  Bilirubin, Direct 0.0 - 0.3 mg/dL 0.1 0.0 -    Assessment and Plan:   Healthcare maintenance  Health Maintenance Exam: The patient's preventative maintenance and recommended screening tests for an annual wellness exam were reviewed in full today. Brought up to date unless services declined.  Counselled on the importance of diet, exercise, and its role in overall health and mortality. The patient's FH and SH was reviewed, including their home life, tobacco status, and drug and alcohol status.  Follow-up: No Follow-up on file. Unless noted, follow-up in 1 year for Health Maintenance Exam.  Signed,  Frederico Hamman T. Porschea Borys, MD   Patient's Medications   No medications on file

## 2015-12-19 NOTE — Progress Notes (Signed)
Pre visit review using our clinic review tool, if applicable. No additional management support is needed unless otherwise documented below in the visit note. 

## 2016-01-08 IMAGING — CR DG CHEST 1V PORT
1 series · 1 of 1 positions shown · non-contrast
Comparison: None.

CLINICAL DATA: Pt reports that [REDACTED] he started having fever and
chills, he went to PMD and they tested him for the flu it was
negative they put him on tamiflu. Pt is diapheretic. He is also
having N/V, taking zofran but they are coming right back up. Initial
encounter.

EXAM:
PORTABLE CHEST - 1 VIEW

[ap]
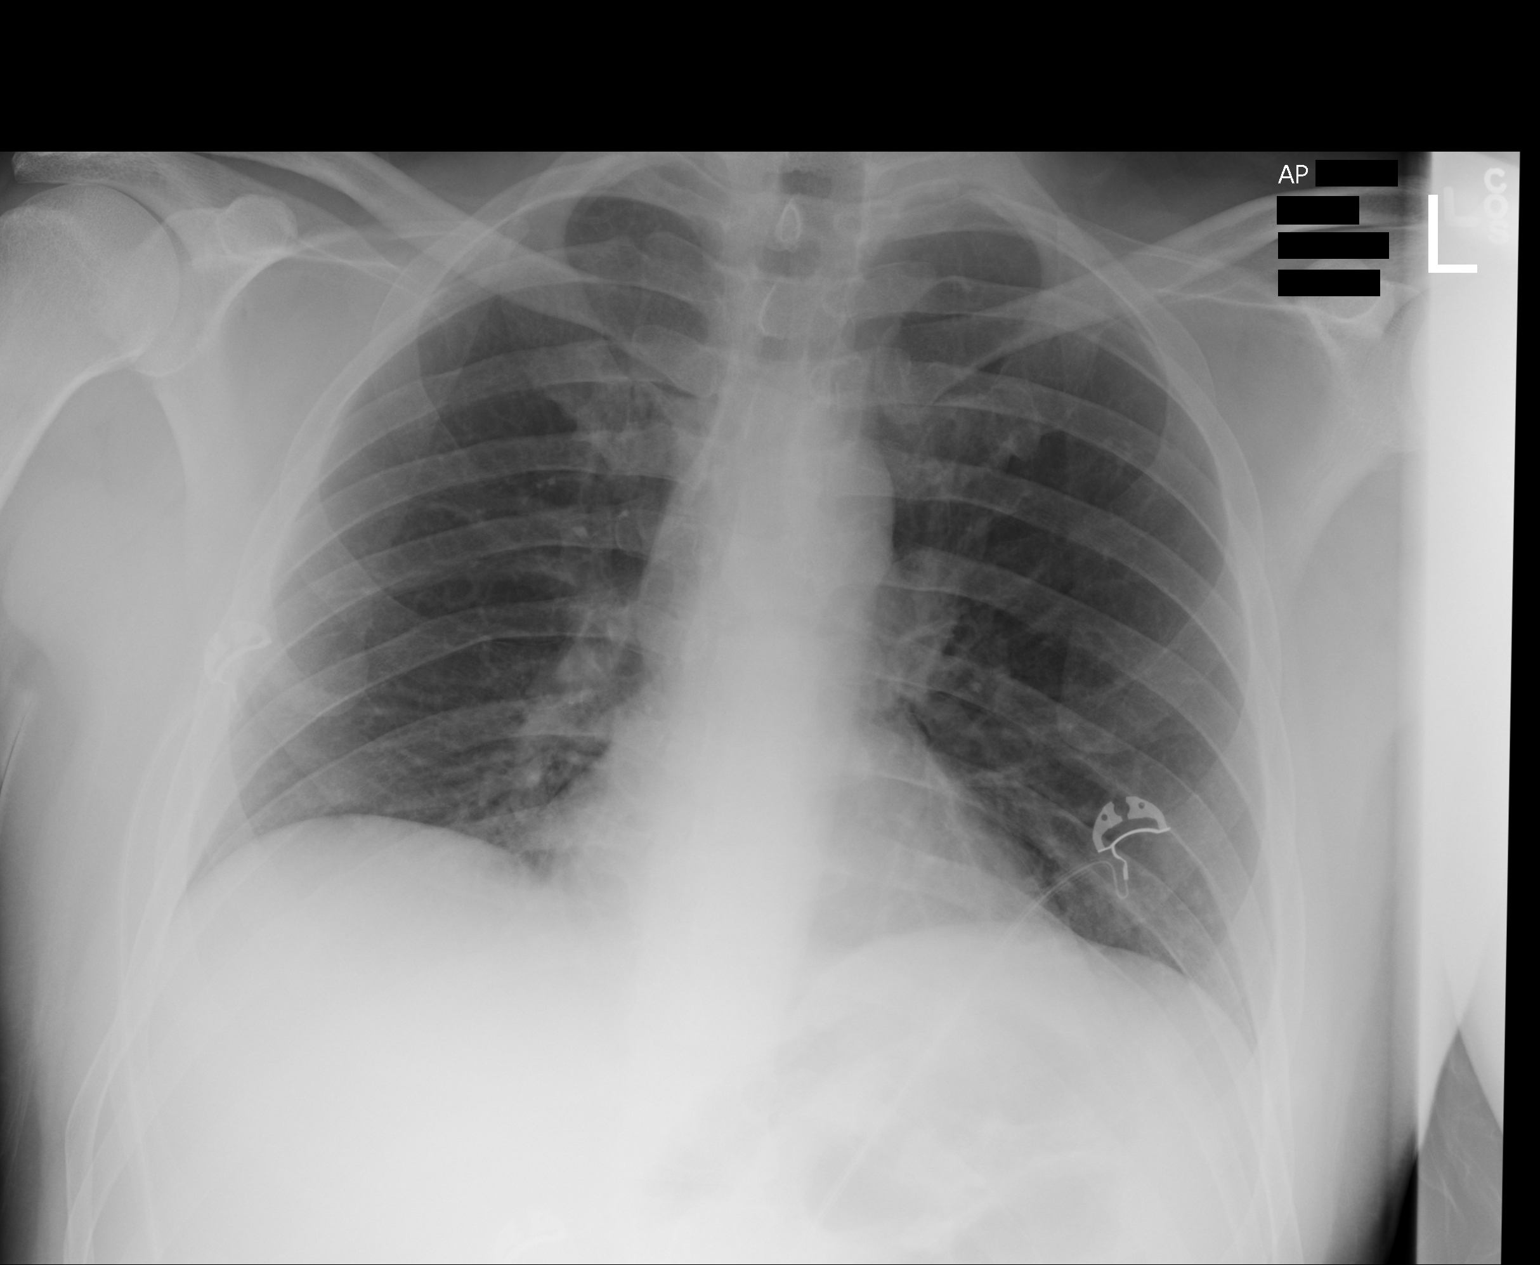

[1 of 1 positions shown; findings below may reference images not displayed]

FINDINGS: Numerous leads and wires project over the chest. Midline trachea.
Normal heart size. No pleural effusion or pneumothorax. Milder low
lung volumes with mild bibasilar atelectasis.
IMPRESSION: No acute cardiopulmonary disease.

## 2016-01-09 IMAGING — CT CT ABD-PELV W/O CM
2 of 4 series · 15 of 46 positions shown, 17 images · non-contrast
Comparison: None.

CLINICAL DATA: Abdominal pain with fever and vomiting yesterday.
Onset of diarrhea today. Initial encounter.

EXAM:
CT ABDOMEN AND PELVIS WITHOUT CONTRAST
TECHNIQUE: Multidetector CT imaging of the abdomen and pelvis was performed
following the standard protocol without IV contrast.

[Series 2: stone standard full · axial · 0.75mm/px · z∈[-374,+71]mm · 12 of 107 slices shown, 14 images]
[im 9/107  soft-tissue]
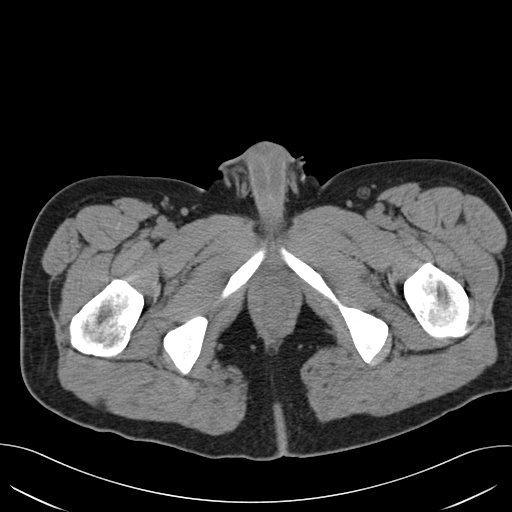
[im 9/107  bone]
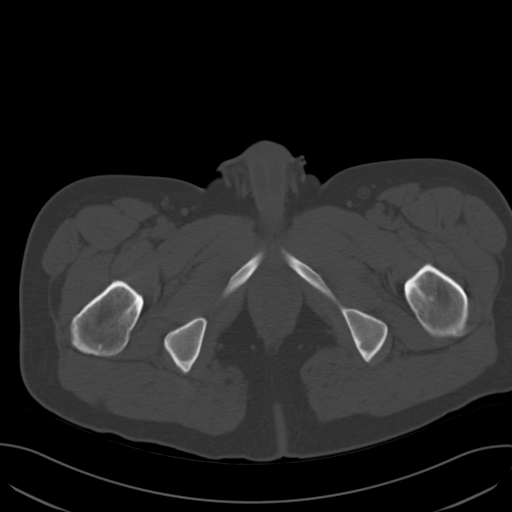
[im 17/107  soft-tissue]
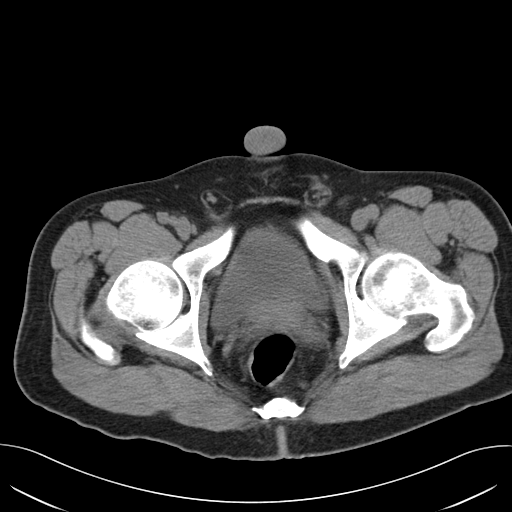
[im 25/107  soft-tissue]
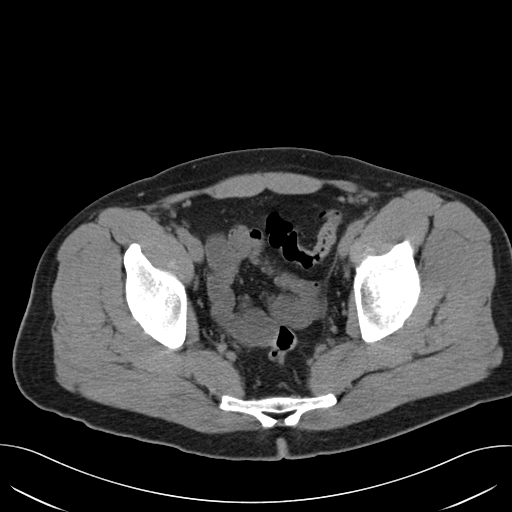
[im 33/107  soft-tissue]
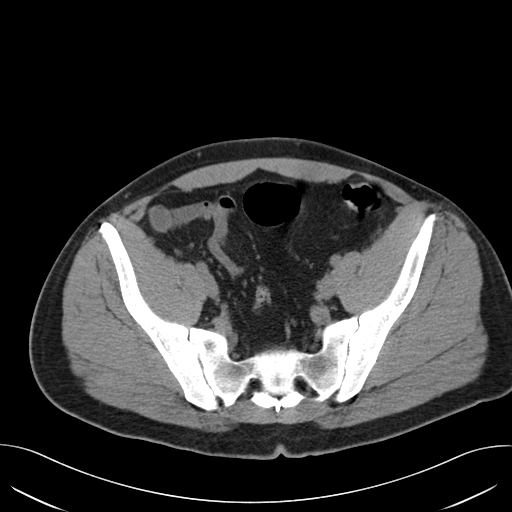
[im 41/107  soft-tissue]
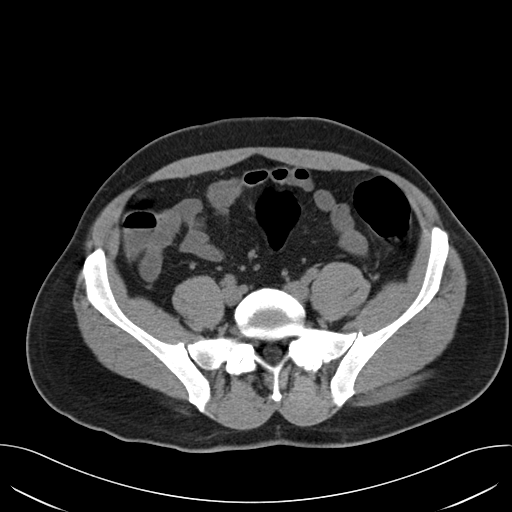
[im 49/107  soft-tissue]
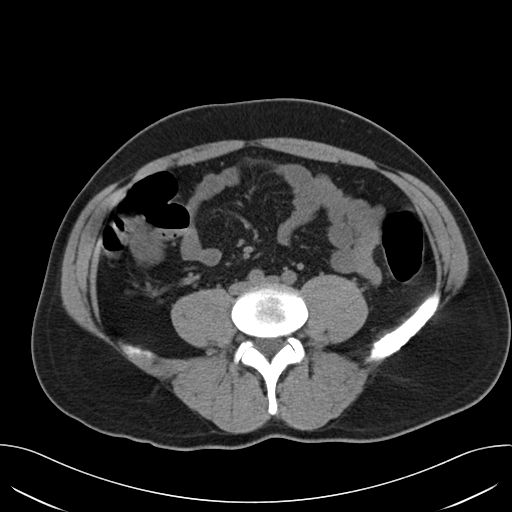
[im 58/107  soft-tissue]
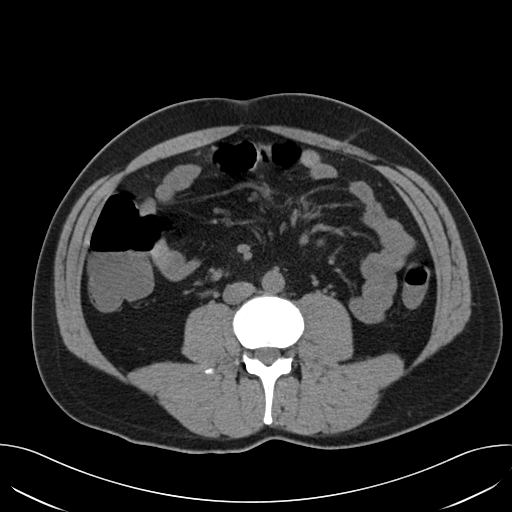
[im 66/107  soft-tissue]
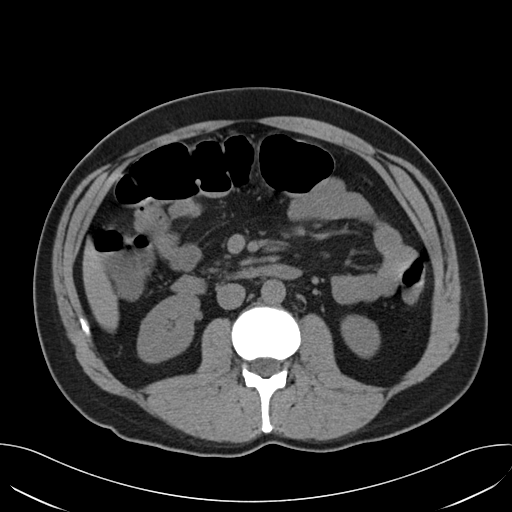
[im 74/107  soft-tissue]
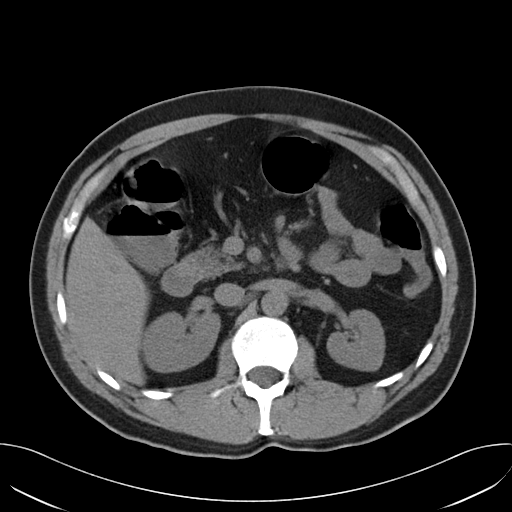
[im 74/107  bone]
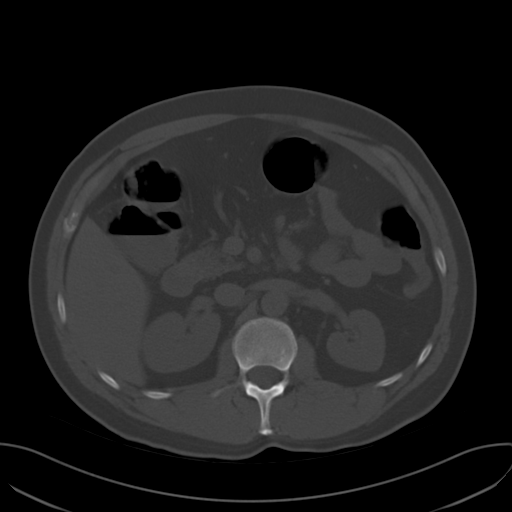
[im 82/107  soft-tissue]
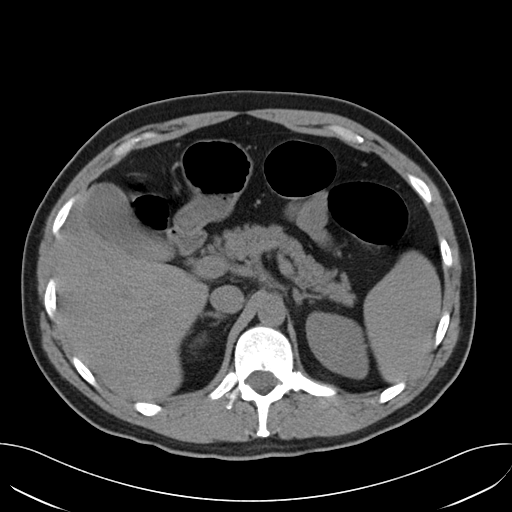
[im 90/107  soft-tissue]
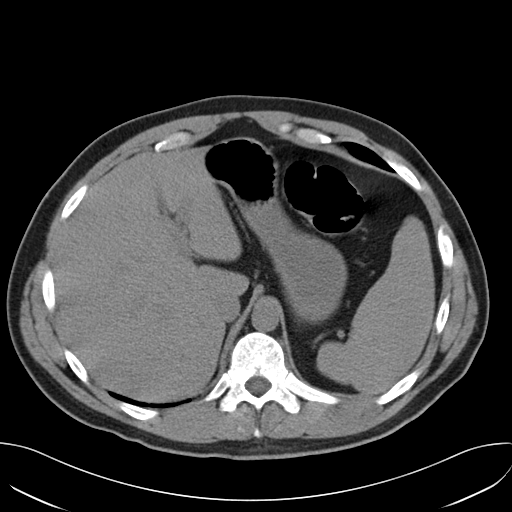
[im 98/107  soft-tissue]
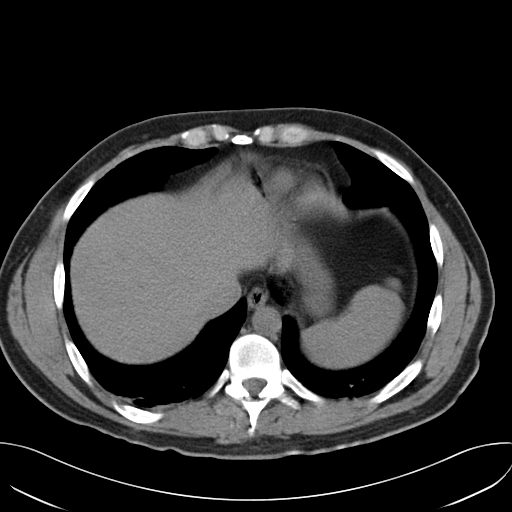

[Series 5: cor stone standard full · coronal · 0.73mm/px · 3 of 128 slices shown]
[im 43/128  soft-tissue]
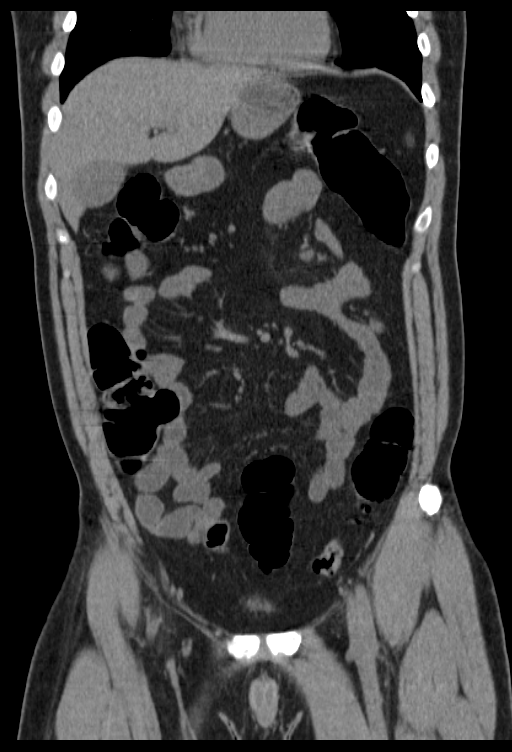
[im 57/128  soft-tissue]
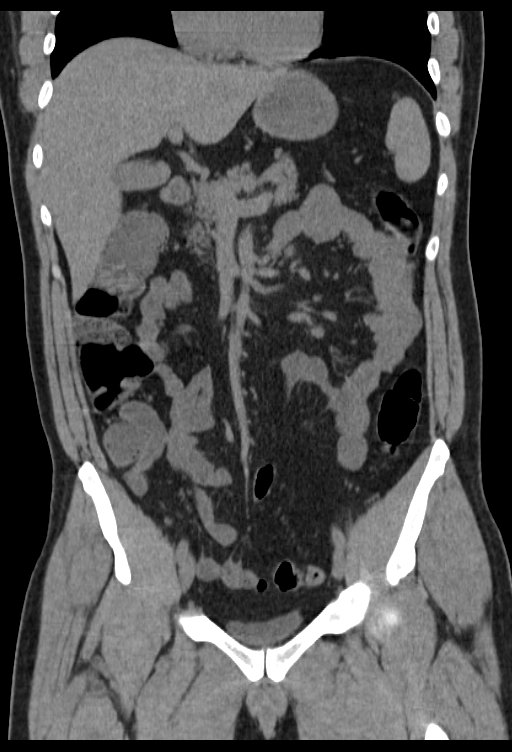
[im 71/128  soft-tissue]
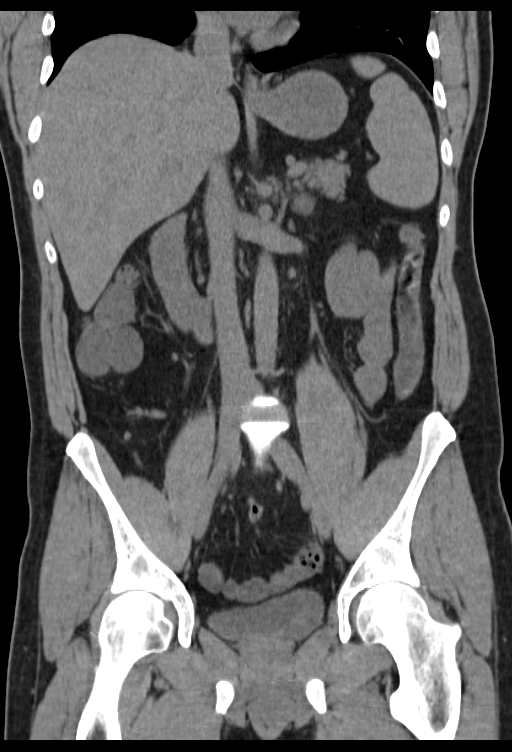

[15 of 46 positions shown; findings below may reference images not displayed]

FINDINGS: Lower chest: Mild atelectasis is present at both lung bases. There
is no significant pleural or pericardial effusion.

Hepatobiliary: As evaluated in the noncontrast state, the liver
appears unremarkable. No evidence of gallstones, gallbladder wall
thickening or biliary dilatation.

Pancreas: Unremarkable. No pancreatic ductal dilatation or
surrounding inflammatory changes.

Spleen: Normal in size without focal abnormality.

Adrenals/Urinary Tract: Both adrenal glands appear normal.The
kidneys appear normal without evidence of urinary tract calculus or
hydronephrosis. No bladder abnormalities are seen.

Stomach/Bowel: Bowel assessment limited by the lack of oral and
intravenous contrast. There is fluid throughout the stomach and
colon. There is no bowel wall thickening or surrounding inflammatory
change. The appendix is questionably visualized and appears normal
in caliber. There is no pericecal inflammation or extraluminal fluid
collection.

Vascular/Lymphatic: There are few minimally prominent lymph nodes
within the small bowel mesentery, likely reactive. There is no
retroperitoneal or pelvic lymphadenopathy. No significant vascular
findings are present.

Reproductive: Unremarkable.

Other: No evidence of abdominal wall mass or hernia.

Musculoskeletal: No acute or significant osseous findings.
IMPRESSION: 1. Fluid filled bowel suggesting possible gastroenteritis with
probable reactive lymph nodes in the small bowel mesentery. No
evidence of bowel obstruction or appendicitis.
2. The solid visceral organs appear unremarkable.
3. Mild atelectasis at both lung bases.

## 2017-04-15 ENCOUNTER — Other Ambulatory Visit: Payer: Self-pay | Admitting: Family Medicine

## 2017-04-15 DIAGNOSIS — Z0000 Encounter for general adult medical examination without abnormal findings: Secondary | ICD-10-CM

## 2017-04-18 ENCOUNTER — Other Ambulatory Visit (INDEPENDENT_AMBULATORY_CARE_PROVIDER_SITE_OTHER): Payer: Managed Care, Other (non HMO)

## 2017-04-18 DIAGNOSIS — Z0000 Encounter for general adult medical examination without abnormal findings: Secondary | ICD-10-CM

## 2017-04-18 LAB — HEPATIC FUNCTION PANEL
ALBUMIN: 4.4 g/dL (ref 3.5–5.2)
ALK PHOS: 70 U/L (ref 39–117)
ALT: 19 U/L (ref 0–53)
AST: 15 U/L (ref 0–37)
Bilirubin, Direct: 0.1 mg/dL (ref 0.0–0.3)
TOTAL PROTEIN: 6.8 g/dL (ref 6.0–8.3)
Total Bilirubin: 0.5 mg/dL (ref 0.2–1.2)

## 2017-04-18 LAB — LIPID PANEL
CHOLESTEROL: 173 mg/dL (ref 0–200)
HDL: 46.3 mg/dL (ref >39.00)
LDL Cholesterol: 90 mg/dL (ref 0–99)
NONHDL: 126.35
TRIGLYCERIDES: 180 mg/dL — AB (ref 0.0–149.0)
Total CHOL/HDL Ratio: 4
VLDL: 36 mg/dL (ref 0.0–40.0)

## 2017-04-18 LAB — BASIC METABOLIC PANEL
BUN: 18 mg/dL (ref 6–23)
CHLORIDE: 106 meq/L (ref 96–112)
CO2: 29 mEq/L (ref 19–32)
Calcium: 9.5 mg/dL (ref 8.4–10.5)
Creatinine, Ser: 1.39 mg/dL (ref 0.40–1.50)
GFR: 58.71 mL/min — AB (ref >60.00)
GLUCOSE: 82 mg/dL (ref 70–99)
POTASSIUM: 4.1 meq/L (ref 3.5–5.1)
Sodium: 141 mEq/L (ref 135–145)

## 2017-04-18 LAB — CBC WITH DIFFERENTIAL/PLATELET
BASOS PCT: 0.9 % (ref 0.0–3.0)
Basophils Absolute: 0.1 10*3/uL (ref 0.0–0.1)
EOS PCT: 3.8 % (ref 0.0–5.0)
Eosinophils Absolute: 0.3 10*3/uL (ref 0.0–0.7)
HEMATOCRIT: 49.4 % (ref 39.0–52.0)
Hemoglobin: 16.3 g/dL (ref 13.0–17.0)
LYMPHS ABS: 2.1 10*3/uL (ref 0.7–4.0)
LYMPHS PCT: 27.6 % (ref 12.0–46.0)
MCHC: 33.1 g/dL (ref 30.0–36.0)
MCV: 86.9 fl (ref 78.0–100.0)
Monocytes Absolute: 0.5 10*3/uL (ref 0.1–1.0)
Monocytes Relative: 7 % (ref 3.0–12.0)
NEUTROS ABS: 4.6 10*3/uL (ref 1.4–7.7)
Neutrophils Relative %: 60.7 % (ref 43.0–77.0)
PLATELETS: 238 10*3/uL (ref 150.0–400.0)
RBC: 5.69 Mil/uL (ref 4.22–5.81)
RDW: 13.1 % (ref 11.5–15.5)
WBC: 7.5 10*3/uL (ref 4.0–10.5)

## 2017-04-18 LAB — PSA: PSA: 0.96 ng/mL (ref 0.10–4.00)

## 2017-04-25 ENCOUNTER — Encounter: Payer: Self-pay | Admitting: Family Medicine

## 2017-04-25 ENCOUNTER — Ambulatory Visit (INDEPENDENT_AMBULATORY_CARE_PROVIDER_SITE_OTHER): Payer: Managed Care, Other (non HMO) | Admitting: Family Medicine

## 2017-04-25 VITALS — BP 108/70 | HR 76 | Temp 97.5°F | Ht 74.0 in | Wt 195.0 lb

## 2017-04-25 DIAGNOSIS — Z23 Encounter for immunization: Secondary | ICD-10-CM

## 2017-04-25 DIAGNOSIS — Z0000 Encounter for general adult medical examination without abnormal findings: Secondary | ICD-10-CM | POA: Diagnosis not present

## 2017-04-25 MED ORDER — CLINDAMYCIN PHOSPHATE 1 % EX SOLN: CUTANEOUS | 5 refills | Status: DC

## 2017-04-25 NOTE — Progress Notes (Signed)
Dr. Frederico Hamman T. Shatyra Becka, MD, [facility] [place], [address] Phone: [phone] Fax: [phone]  04/25/2017  Patient: Ethan Jones, MRN: [id number], DOB: [birth year], 46 y.o.  Primary Physician:  Owens Loffler, MD   Chief Complaint  Patient presents with  . Annual Exam   Subjective:   Ethan Jones is a 46 y.o. pleasant patient who presents with the following:  Preventative Health Maintenance Visit:  Health Maintenance Summary Reviewed and updated, unless pt declines services.  Tobacco History Reviewed. Alcohol: No concerns, no excessive use Exercise Habits: Some activity, rec at least 30 mins 5 times a week STD concerns: no risk or activity to increase risk Drug Use: None Encouraged self-testicular check  Intermittent pains, face goes numb some  Some bumps on his head 20 pound weight loss   Health Maintenance  Topic Date Due  . INFLUENZA VACCINE  02/27/2017  . TETANUS/TDAP  05/14/2023  . HIV Screening  Completed   Immunization History  Administered Date(s) Administered  . Influenza,inj,Quad PF,6+ Mos 04/25/2017  . Influenza,inj,quad, With Preservative 07/11/2015  . Tdap 05/13/2013   Patient Active Problem List   Diagnosis Date Noted  . 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 No past surgical history on file. Social History   Social History  . Marital status: Married    Spouse name: N/A  . Number of children: N/A  . Years of education: N/A   Occupational History  . Not on file.   Social History Main Topics  . Smoking status: Never Smoker  . Smokeless tobacco: Never Used  . Alcohol use No  . Drug use: No  . Sexual activity: Not on file   Other Topics Concern  . Not on file   Social History Narrative  . No narrative on file   No family history on file. No Known Allergies  Medication list has been reviewed and updated.   General:  Denies fever, chills, sweats. No significant weight loss. Eyes: Denies blurring,significant itching ENT: Denies earache, sore throat, and hoarseness. Cardiovascular: Denies chest pains, palpitations, dyspnea on exertion Respiratory: Denies cough, dyspnea at rest,wheeezing Breast: no concerns about lumps GI: Denies nausea, vomiting, diarrhea, constipation, change in bowel habits, abdominal pain, melena, hematochezia GU: Denies penile discharge, ED, urinary flow / outflow problems. No STD concerns. Musculoskeletal: Denies back pain, joint pain Derm: Denies rash, itching Neuro: Denies  paresthesias, frequent falls, frequent headaches Psych: Denies depression, anxiety Endocrine: Denies cold intolerance, heat intolerance, polydipsia Heme: Denies enlarged lymph nodes Allergy: No hayfever  Objective:   BP 108/70 (BP Location: Left Arm, Patient Position: Sitting, Cuff Size: Normal)   Pulse 76   Temp (!) 97.5 F (36.4 C) (Oral)   Ht 6' 2" (1.88 m)   Wt 195 lb (88.5 kg)   SpO2 97%   BMI 25.04 kg/m  Ideal Body Weight: Weight in (lb) to have BMI = 25: 194.3  No exam data present  GEN: well developed, well nourished, no acute distress Eyes: conjunctiva and lids normal, PERRLA, EOMI ENT: TM clear, nares clear, oral exam WNL Neck: supple, no lymphadenopathy, no thyromegaly, no JVD Pulm: clear to auscultation and percussion, respiratory effort normal CV: regular rate and rhythm, S1-S2, no murmur, rub or gallop, no bruits, peripheral pulses normal and symmetric, no cyanosis, clubbing, edema or varicosities GI: soft, non-tender; no hepatosplenomegaly, masses; active bowel sounds all quadrants GU: no hernia, testicular mass, penile discharge Lymph: no  cervical, axillary or inguinal adenopathy MSK: gait normal, muscle tone and strength WNL, no joint swelling, effusions, discoloration, crepitus  SKIN: clear, good turgor, color WNL, no rashes, lesions, or ulcerations Neuro: normal mental status,  normal strength, sensation, and motion Psych: alert; oriented to person, place and time, normally interactive and not anxious or depressed in appearance. All labs reviewed with patient.  Lipids:    Component Value Date/Time   CHOL 173 04/18/2017 0855   TRIG 180.0 (H) 04/18/2017 0855   HDL 46.30 04/18/2017 0855   LDLDIRECT 112.4 05/07/2013 0806   VLDL 36.0 04/18/2017 0855   CHOLHDL 4 04/18/2017 0855   CBC: CBC Latest Ref Rng & Units 04/18/2017 12/12/2015 06/20/2014  WBC 4.0 - 10.5 K/uL 7.5 6.8 5.8  Hemoglobin 13.0 - 17.0 g/dL 16.3 16.2 14.2  Hematocrit 39.0 - 52.0 % 49.4 47.8 41.8  Platelets 150.0 - 400.0 K/uL 238.0 201.0 629    Basic Metabolic Panel:    Component Value Date/Time   NA 141 04/18/2017 0855   NA 143 06/20/2014 0401   K 4.1 04/18/2017 0855   K 3.7 06/20/2014 0401   CL 106 04/18/2017 0855   CL 109 (H) 06/20/2014 0401   CO2 29 04/18/2017 0855   CO2 26 06/20/2014 0401   BUN 18 04/18/2017 0855   BUN 6 (L) 06/20/2014 0401   CREATININE 1.39 04/18/2017 0855   CREATININE 0.94 06/20/2014 0401   GLUCOSE 82 04/18/2017 0855   GLUCOSE 79 06/20/2014 0401   CALCIUM 9.5 04/18/2017 0855   CALCIUM 8.1 (L) 06/20/2014 0401   Hepatic Function Latest Ref Rng & Units 04/18/2017 12/12/2015 07/07/2014  Total Protein 6.0 - 8.3 g/dL 6.8 7.0 7.3  Albumin 3.5 - 5.2 g/dL 4.4 4.5 4.3  AST 0 - 37 U/L 15 22 50(H)  ALT 0 - 53 U/L 19 26 84(H)  Alk Phosphatase 39 - 117 U/L 70 66 78  Total Bilirubin 0.2 - 1.2 mg/dL 0.5 0.7 0.6  Bilirubin, Direct 0.0 - 0.3 mg/dL 0.1 0.1 0.0    No results found for: TSH Lab Results  Component Value Date   PSA 0.96 04/18/2017    Assessment and Plan:   Healthcare maintenance  Need for influenza vaccination - Plan: Flu Vaccine QUAD 6+ mos PF IM (Fluarix Quad PF)  Health Maintenance Exam: The patient's preventative maintenance and recommended screening tests for an annual wellness exam were reviewed in full today. Brought up to date unless services  declined.  Counselled on the importance of diet, exercise, and its role in overall health and mortality. The patient's FH and SH was reviewed, including their home life, tobacco status, and drug and alcohol status.  Follow-up in 1 year for physical exam or additional follow-up below.  Follow-up: No Follow-up on file. Or follow-up in 1 year if not noted.  Meds ordered this encounter  Medications  . clindamycin (CLEOCIN T) 1 % external solution    Sig: Apply to neck at bedtime as directed    Dispense:  60 mL    Refill:  5   Orders Placed This Encounter  Procedures  . Flu Vaccine QUAD 6+ mos PF IM (Fluarix Quad PF)    Signed,  Spencer T. Copland, MD   Allergies as of 04/25/2017   No Known Allergies     Medication List       Accurate as of 04/25/17  1:54 PM. Always use your most recent med list.          clindamycin 1 %  external solution Commonly known as:  CLEOCIN T Apply to neck at bedtime as directed            Discharge Care Instructions        Start     Ordered   04/25/17 0000  Flu Vaccine QUAD 6+ mos PF IM (Fluarix Quad PF)     04/25/17 0835   04/25/17 0000  clindamycin (CLEOCIN T) 1 % external solution     04/25/17 0859

## 2018-01-02 ENCOUNTER — Ambulatory Visit: Payer: Managed Care, Other (non HMO) | Admitting: Family Medicine

## 2018-01-02 ENCOUNTER — Encounter: Payer: Self-pay | Admitting: Family Medicine

## 2018-01-02 VITALS — BP 110/80 | HR 82 | Temp 98.0°F | Ht 74.0 in | Wt 207.5 lb

## 2018-01-02 DIAGNOSIS — M722 Plantar fascial fibromatosis: Secondary | ICD-10-CM | POA: Diagnosis not present

## 2018-01-02 DIAGNOSIS — R0789 Other chest pain: Secondary | ICD-10-CM

## 2018-01-02 DIAGNOSIS — R071 Chest pain on breathing: Secondary | ICD-10-CM | POA: Diagnosis not present

## 2018-01-02 NOTE — Patient Instructions (Signed)

## 2018-01-02 NOTE — Progress Notes (Signed)
Dr. Karleen HampshireSpencer T. Quintara Bost, MD, [facility] [address] [place], [address] Phone: [phone] Fax: [phone]  01/02/2018  Patient: Ethan Jones, MRN: [id number], DOB: 1971-04-03, 47 y.o.  Primary Physician:  Hannah Beatopland, Ahamed Hofland, MD   Chief Complaint  Patient presents with  . URQ Pain    x >1 month   Subjective:   This 47 y.o. male patient presents with a multimonth long history of heel pain. This is notable for worsening pain first thing in the morning when arising and standing after sitting.   His primary reason for coming to the office today is actually some right-sided lower rib anterior chest wall pain.  He is not having pain in the abdominal cavity, and he is not having pain in and around his organs in the abdominal cavity.  It is in the lower ribs approximately at level 10 rib on the right side.  He has not had any trauma or injury that he can recall.  Prior foot or ankle fractures: none Prior operations: none Orthotics or bracing: none Medications: none PT or home rehab: none Night splints: no Ice massage: no Ball massage: no  Metatarsal pain: no  The PMH, PSH, Social History, Family History, Medications, and allergies [facility], and have been updated if relevant.  Patient Active Problem List   Diagnosis Date Noted  . 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 No pertinent past medical history.  History reviewed. No pertinent surgical history.  Social History   Socioeconomic History  . Marital status: Married    Spouse name: Not on file  . Number of children: Not on file  . Years of education: Not on file  . Highest education level: Not on file  Occupational History  . Not on file  Social Needs  . Financial resource strain: Not on file  . Food insecurity:    Worry: Not on file    Inability: Not on file  . Transportation needs:    Medical: Not on file   Non-medical: Not on file  Tobacco Use  . Smoking status: Never Smoker  . Smokeless tobacco: Never Used  Substance and Sexual Activity  . Alcohol use: No  . Drug use: No  . Sexual activity: Not on file  Lifestyle  . Physical activity:    Days per week: Not on file    Minutes per session: Not on file  . Stress: Not on file  Relationships  . Social connections:    Talks on phone: Not on file    Gets together: Not on file    Attends religious service: Not on file    Active member of club or organization: Not on file    Attends meetings of clubs or organizations: Not on file    Relationship status: Not on file  . Intimate partner violence:    Fear of current or ex partner: Not on file    Emotionally abused: Not on file    Physically abused: Not on file    Forced sexual activity: Not on file  Other Topics Concern  . Not on file  Social History Narrative  . Not on file    History reviewed. No pertinent family history.  No Known Allergies  Medication list reviewed and updated in full in [facility].  GEN: No fevers, chills. Nontoxic. Primarily MSK c/o today. MSK: Detailed in the HPI GI: tolerating PO intake without difficulty Neuro:  No numbness, parasthesias, or tingling associated. Otherwise the pertinent positives of the ROS are noted above.   Objective:   Blood pressure 110/80, pulse 82, temperature 98 F (36.7 C), temperature source Oral, height 6\' 2"  (1.88 m), weight 207 lb 8 oz (94.1 kg).  GEN: Well-developed,well-nourished,in no acute distress; alert,appropriate and cooperative throughout examination HEENT: Normocephalic and atraumatic without obvious abnormalities. Ears, externally no deformities PULM: Breathing comfortably in no respiratory distress EXT: No clubbing, cyanosis, or edema PSYCH: Normally interactive. Cooperative during the interview. Pleasant. Friendly and conversant. Not anxious or depressed appearing. Normal, full affect.  On the right side  on approximate level 10 rib, the patient has pain in the intercostal region with palpation.  This is only localized with local palpation.  Barrel sign is negative.  ABD: S, NT, ND, + BS, No rebound, No HSM   L foot Echymosis: no Edema: no ROM: full LE B Gait: heel toe, non-antalgic MT pain: no Callus pattern: none Lateral Mall: NT Medial Mall: NT Talus: NT Navicular: NT Calcaneous: NT Metatarsals: NT 5th MT: NT Phalanges: NT Achilles: NT Plantar Fascia: tender, medial along PF. Pain with forced dorsi Fat Pad: NT Peroneals: NT Post Tib: NT Great Toe: Nml motion Ant Drawer: neg Other foot breakdown: none Long arch: preserved Transverse arch: preserved Hindfoot breakdown: none Sensation: intact  Assessment and Plan:   Costochondral pain  Plantar fasciitis, left  I am good to have the patient do scheduled anti-inflammatories for 1 month's time.  He has Aleve at home.  2 tablets p.o. twice daily.  If symptoms persist beyond this, then we could consider other interventions or testing.  Anatomy reviewed. Stretching and rehab are critically important to the treatment of PF. Reviewed footwear. Rigid soles have been shown to help with PF.  Reviewed rehab of stretching and calf raises.  Reviewed rehab from [employer]  Could benefit from a corticosteroid injection if conservative treatment fails.  Follow-up: No follow-ups on file.  Meds ordered this encounter  Medications  . clindamycin (CLEOCIN T) 1 % external solution    Sig: Apply to neck at bedtime as directed    Dispense:  60 mL    Refill:  5   Signed,  Randi College T. Lucyann Romano, MD   Patient's Medications  New Prescriptions   No medications on file  Previous Medications   No medications on file  Modified Medications   Modified Medication Previous Medication   CLINDAMYCIN (CLEOCIN T) 1 % EXTERNAL SOLUTION clindamycin (CLEOCIN T) 1 % external solution      Apply to neck at bedtime  as directed    Apply to neck at bedtime as directed  Discontinued Medications   No medications on file

## 2018-01-05 MED ORDER — CLINDAMYCIN PHOSPHATE 1 % EX SOLN: CUTANEOUS | 5 refills | Status: DC

## 2018-01-06 ENCOUNTER — Telehealth: Payer: Self-pay | Admitting: *Deleted

## 2018-01-06 NOTE — Telephone Encounter (Signed)
Victorino DikeJennifer (wife) dropped off [employer] form for Dr. Patsy Lageropland to complete.  Form placed in Dr. Cyndie Chimeopland's in box.

## 2018-01-06 NOTE — Telephone Encounter (Signed)
done

## 2018-01-28 ENCOUNTER — Telehealth: Payer: Self-pay | Admitting: Family Medicine

## 2018-01-28 NOTE — Telephone Encounter (Signed)
Form placed in Dr. Cyndie Chimeopland's in box to complete on Monday when he return to clinic.

## 2018-01-28 NOTE — Telephone Encounter (Signed)
Pt spouse ropped off [employer] form to be filled out. She apologizes for needing another form, said [employer] requires their own form. I placed in Rx tower. Please call when ready for pick up.

## 2018-02-03 NOTE — Telephone Encounter (Signed)
Left message asking pt to call office Form is ready for pick up

## 2018-02-03 NOTE — Telephone Encounter (Signed)
Pt aware.

## 2018-09-25 ENCOUNTER — Other Ambulatory Visit: Payer: Self-pay

## 2018-09-25 ENCOUNTER — Encounter: Payer: Self-pay | Admitting: Emergency Medicine

## 2018-09-25 ENCOUNTER — Ambulatory Visit
Admission: EM | Admit: 2018-09-25 | Discharge: 2018-09-25 | Disposition: A | Discharge status: Home / Self Care | Payer: Managed Care, Other (non HMO) | Attending: Family Medicine | Admitting: Family Medicine

## 2018-09-25 DIAGNOSIS — R05 Cough: Secondary | ICD-10-CM

## 2018-09-25 DIAGNOSIS — R69 Illness, unspecified: Principal | ICD-10-CM

## 2018-09-25 DIAGNOSIS — R509 Fever, unspecified: Secondary | ICD-10-CM

## 2018-09-25 DIAGNOSIS — J111 Influenza due to unidentified influenza virus with other respiratory manifestations: Secondary | ICD-10-CM

## 2018-09-25 MED ORDER — OSELTAMIVIR PHOSPHATE 75 MG PO CAPS: 75.0000 mg | ORAL_CAPSULE | Freq: Two times a day (BID) | ORAL | 0 refills | Status: DC

## 2018-09-25 MED ORDER — BENZONATATE 200 MG PO CAPS: ORAL_CAPSULE | ORAL | 0 refills | Status: DC

## 2018-09-25 NOTE — Discharge Instructions (Addendum)
Drink plenty of fluids.  Rest as much as possible.  Use Tylenol or Motrin for fever chills or body aches.  °

## 2018-09-25 NOTE — ED Triage Notes (Signed)
Patient c/o cough, chills, body ache and fever that started this afternoon. Patient states his wife and son were diagnosed with Flu A this week.

## 2018-09-25 NOTE — ED Provider Notes (Signed)
[facility]    CSN: [id number] Arrival date & time: 09/25/18  1908     History   Chief Complaint Chief Complaint  Patient presents with  . Cough  . Chills  . Generalized Body Aches    HPI Ethan Jones is a 48 y.o. male.   HPI  48 year old male presents with the sudden onset this afternoon of cough chills body ache and fever.  He states that his wife and son were both diagnosed with flu a this week.  Temperature 99.4 pulse rate 97 respirations 18 O2 sats on room air 100%     History reviewed. No pertinent past medical history.  Patient Active Problem List   Diagnosis Date Noted  . 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 No pertinent surgical history.     Home Medications    Prior to Admission medications   Medication Sig Start Date End Date Taking? Authorizing Provider  benzonatate (TESSALON) 200 MG capsule Take one cap TID PRN cough 09/25/18   Lutricia Feil, PA-C  oseltamivir (TAMIFLU) 75 MG capsule Take 1 capsule (75 mg total) by mouth every 12 (twelve) hours. 09/25/18   Lutricia Feil, PA-C    Family History History reviewed. No pertinent family history.  Social History Social History   Tobacco Use  . Smoking status: Never Smoker  . Smokeless tobacco: Never Used  Substance Use Topics  . Alcohol use: No  . Drug use: No     Allergies   Patient has no known allergies.   Review of Systems Review of Systems  Constitutional: Positive for activity change, chills, fatigue and fever. Negative for appetite change.  HENT: Positive for congestion.   Respiratory: Positive for cough.   All other systems reviewed and are negative.    Physical Exam Triage Vital Signs ED Triage Vitals  Enc Vitals Group     BP 09/25/18 1928 120/86     Pulse Rate 09/25/18 1928 97     Resp 09/25/18 1928 18     Temp 09/25/18 1928 99.4 F (37.4 C)     Temp Source 09/25/18 1928 Oral     SpO2 09/25/18 1928 100 %     Weight  09/25/18 1927 205 lb (93 kg)     Height 09/25/18 1927 6\' 2"  (1.88 m)     Head Circumference --      Peak Flow --      Pain Score 09/25/18 1927 0     Pain Loc --      Pain Edu? --      Excl. in GC? --    No data found.  Updated Vital Signs BP 120/86 (BP Location: Right Arm)   Pulse 97   Temp 99.4 F (37.4 C) (Oral)   Resp 18   Ht 6\' 2"  (1.88 m)   Wt 205 lb (93 kg)   SpO2 100%   BMI 26.32 kg/m   Visual Acuity Right Eye Distance:   Left Eye Distance:   Bilateral Distance:    Right Eye Near:   Left Eye Near:    Bilateral Near:     Physical Exam Vitals signs and nursing note reviewed.  Constitutional:      General: He is not in acute distress.    Appearance: Normal appearance. He is normal weight. He is not ill-appearing, toxic-appearing or diaphoretic.  HENT:     Head: Normocephalic and atraumatic.     Right Ear: Tympanic membrane, ear canal and external  ear normal.     Left Ear: Tympanic membrane, ear canal and external ear normal.     Nose: Nose normal.     Mouth/Throat:     Mouth: Mucous membranes are moist.  Eyes:     General:        Right eye: No discharge.        Left eye: No discharge.     Conjunctiva/sclera: Conjunctivae normal.  Neck:     Musculoskeletal: Normal range of motion and neck supple.  Pulmonary:     Effort: Pulmonary effort is normal.     Breath sounds: Normal breath sounds.  Musculoskeletal: Normal range of motion.  Lymphadenopathy:     Cervical: No cervical adenopathy.  Skin:    General: Skin is warm and dry.  Neurological:     General: No focal deficit present.     Mental Status: He is alert and oriented to person, place, and time.  Psychiatric:        Mood and Affect: Mood normal.        Behavior: Behavior normal.        Thought Content: Thought content normal.        Judgment: Judgment normal.      UC Treatments / Results  Labs (all labs ordered are listed, but only abnormal results are displayed) Labs Reviewed - No data  to display  EKG None  Radiology No results found.  Procedures Procedures (including critical care time)  Medications Ordered in UC Medications - No data to display  Initial Impression / Assessment and Plan / UC Course  I have reviewed the triage vital signs and the nursing notes.  Pertinent labs & imaging results that were available during my care of the patient were reviewed by me and considered in my medical decision making (see chart for details).   Patient has an influenza type illness.  Him with Tamiflu and Tessalon Perles for cough.  We will drink plenty of fluids get adequate rest and use Tylenol Motrin for aches or pains.  Worsens he should go to the emergency room   Final Clinical Impressions(s) / UC Diagnoses   Final diagnoses:  Influenza-like illness     Discharge Instructions     Drink plenty of fluids.  Rest as much as possible.  Use Tylenol or Motrin for fever chills or body aches.    ED Prescriptions    Medication Sig Dispense Auth. Provider   oseltamivir (TAMIFLU) 75 MG capsule Take 1 capsule (75 mg total) by mouth every 12 (twelve) hours. 10 capsule Lutricia Feil, PA-C   benzonatate (TESSALON) 200 MG capsule Take one cap TID PRN cough 30 capsule Lutricia Feil, PA-C     Controlled Substance Prescriptions [place] Controlled Substance Registry consulted? Not Applicable   Lutricia Feil, PA-C 09/25/18 2123

## 2019-03-26 ENCOUNTER — Emergency Department
Admission: EM | Admit: 2019-03-26 | Discharge: 2019-03-26 | Disposition: A | Discharge status: Home / Self Care | Payer: Managed Care, Other (non HMO) | Attending: Emergency Medicine | Admitting: Emergency Medicine

## 2019-03-26 ENCOUNTER — Other Ambulatory Visit: Payer: Self-pay

## 2019-03-26 ENCOUNTER — Emergency Department: Payer: Managed Care, Other (non HMO)

## 2019-03-26 DIAGNOSIS — R0789 Other chest pain: Secondary | ICD-10-CM | POA: Diagnosis not present

## 2019-03-26 DIAGNOSIS — R0602 Shortness of breath: Secondary | ICD-10-CM | POA: Insufficient documentation

## 2019-03-26 DIAGNOSIS — R42 Dizziness and giddiness: Secondary | ICD-10-CM | POA: Diagnosis not present

## 2019-03-26 DIAGNOSIS — R079 Chest pain, unspecified: Secondary | ICD-10-CM

## 2019-03-26 LAB — CBC WITH DIFFERENTIAL/PLATELET
Abs Immature Granulocytes: 0.02 10*3/uL (ref 0.00–0.07)
Basophils Absolute: 0.1 10*3/uL (ref 0.0–0.1)
Basophils Relative: 1 %
Eosinophils Absolute: 0.3 10*3/uL (ref 0.0–0.5)
Eosinophils Relative: 4 %
HCT: 45.5 % (ref 39.0–52.0)
Hemoglobin: 15.2 g/dL (ref 13.0–17.0)
Immature Granulocytes: 0 %
Lymphocytes Relative: 33 %
Lymphs Abs: 2.8 10*3/uL (ref 0.7–4.0)
MCH: 28.5 pg (ref 26.0–34.0)
MCHC: 33.4 g/dL (ref 30.0–36.0)
MCV: 85.2 fL (ref 80.0–100.0)
Monocytes Absolute: 0.6 10*3/uL (ref 0.1–1.0)
Monocytes Relative: 7 %
Neutro Abs: 4.6 10*3/uL (ref 1.7–7.7)
Neutrophils Relative %: 55 %
Platelets: 230 10*3/uL (ref 150–400)
RBC: 5.34 MIL/uL (ref 4.22–5.81)
RDW: 12.3 % (ref 11.5–15.5)
WBC: 8.3 10*3/uL (ref 4.0–10.5)
nRBC: 0 % (ref 0.0–0.2)

## 2019-03-26 LAB — PROTIME-INR
INR: 1 (ref 0.8–1.2)
Prothrombin Time: 12.9 seconds (ref 11.4–15.2)

## 2019-03-26 LAB — COMPREHENSIVE METABOLIC PANEL
ALT: 29 U/L (ref 0–44)
AST: 23 U/L (ref 15–41)
Albumin: 4.1 g/dL (ref 3.5–5.0)
Alkaline Phosphatase: 65 U/L (ref 38–126)
Anion gap: 6 (ref 5–15)
BUN: 13 mg/dL (ref 6–20)
CO2: 26 mmol/L (ref 22–32)
Calcium: 9.2 mg/dL (ref 8.9–10.3)
Chloride: 109 mmol/L (ref 98–111)
Creatinine, Ser: 1.19 mg/dL (ref 0.61–1.24)
GFR calc Af Amer: >60 mL/min (ref >60)
GFR calc non Af Amer: >60 mL/min (ref >60)
Glucose, Bld: 97 mg/dL (ref 70–99)
Potassium: 3.5 mmol/L (ref 3.5–5.1)
Sodium: 141 mmol/L (ref 135–145)
Total Bilirubin: 0.4 mg/dL (ref 0.3–1.2)
Total Protein: 6.9 g/dL (ref 6.5–8.1)

## 2019-03-26 LAB — TROPONIN I (HIGH SENSITIVITY)
Troponin I (High Sensitivity): 5 ng/L (ref <18)
Troponin I (High Sensitivity): 4 ng/L (ref <18)

## 2019-03-26 LAB — MAGNESIUM: Magnesium: 2.1 mg/dL (ref 1.7–2.4)

## 2019-03-26 LAB — LIPASE, BLOOD: Lipase: 28 U/L (ref 11–51)

## 2019-03-26 NOTE — ED Provider Notes (Signed)
[facility] Emergency Department Provider Note  ____________________________________________   First MD Initiated Contact with Patient 03/26/19 0321     (approximate)  I have reviewed the triage vital signs and the nursing notes.   HISTORY  Chief Complaint Chest Pain, Shortness of Breath, and Dizziness    HPI Ethan Jones is a 48 y.o. male with no reported chronic medical issues who is generally healthy and presents for evaluation of acute onset chest heaviness with some associated shortness of breath.  He says that he was awakened with the symptoms and he felt like "I was drowning".  He said he was breathing just fine but felt like he could not breathe.  He denies chest pain but says it felt like someone was sitting on his chest.  The symptoms lasted about 15 minutes and then got completely better.  He ambulated around the house and got something to drink and then within a few minutes of going back to bed and lying down again the symptoms recurred.  When EMS arrived he was still having some the symptoms but they were improving and he received a full dose aspirin as well as 1 dose of nitroglycerin in route to the hospital but neither 1 seem to change the symptoms.  His symptoms were gone by the time he got to the ED and he has been asymptomatic since that time.  He has no history of blood clots in the legs of the lungs and has had no unilateral leg pain or swelling.  No recent surgeries or immobilizations.  No history of coronary artery disease, peripheral vascular disease, stroke, hyperlipidemia, diabetes, hypertension, or tobacco use.  His father had heart surgery in his 6450s and his grandfather had heart disease later in life but the patient has no history of heart problems.  He has no history of panic attacks although he did mention that his wife seem to think that was the issue tonight.  He has no issues with sleep apnea or snoring of which he is aware.          History reviewed. No pertinent past medical history.  Patient Active Problem List   Diagnosis Date Noted  . 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 No pertinent surgical history.  Prior to Admission medications   Medication Sig Start Date End Date Taking? Authorizing Provider  benzonatate (TESSALON) 200 MG capsule Take one cap TID PRN cough 09/25/18   Lutricia Feiloemer, William P, PA-C  oseltamivir (TAMIFLU) 75 MG capsule Take 1 capsule (75 mg total) by mouth every 12 (twelve) hours. 09/25/18   Lutricia Feiloemer, William P, PA-C    Allergies Patient has no known allergies.  History reviewed. No pertinent family history.  Social History Social History   Tobacco Use  . Smoking status: Never Smoker  . Smokeless tobacco: Never Used  Substance Use Topics  . Alcohol use: No  . Drug use: No    Review of Systems Constitutional: No fever/chills Eyes: No visual changes. ENT: No sore throat. Cardiovascular: Chest pressure and heaviness Respiratory: Shortness of breath associated with chest heaviness Gastrointestinal: No abdominal pain.  No nausea, no vomiting.  No diarrhea.  No constipation. Genitourinary: Negative for dysuria. Musculoskeletal: Negative for neck pain.  Negative for back pain. Integumentary: Negative for rash. Neurological: Negative for headaches, focal weakness or numbness.   ____________________________________________   PHYSICAL EXAM:  VITAL SIGNS: ED Triage Vitals  Enc Vitals Group     BP  Pulse      Resp      Temp      Temp src      SpO2      Weight      Height      Head Circumference      Peak Flow      Pain Score      Pain Loc      Pain Edu?      Excl. in GC?     Constitutional: Alert and oriented.  Anxious but not in acute distress.  Asymptomatic at this time. Eyes: Conjunctivae are normal.  Head: Atraumatic. Nose: No congestion/rhinnorhea. Mouth/Throat: Mucous membranes are moist. Neck: No stridor.  No meningeal signs.    Cardiovascular: Normal rate, regular rhythm. Good peripheral circulation. Grossly normal heart sounds. Respiratory: Normal respiratory effort.  No retractions. Gastrointestinal: Soft and nontender. No distention.  Musculoskeletal: No lower extremity tenderness nor edema. No gross deformities of extremities. Neurologic:  Normal speech and language. No gross focal neurologic deficits are appreciated.  Skin:  Skin is warm, dry and intact. Psychiatric: Mood and affect are anxious but understandable under the circumstances.  No warning signs of emergent psychiatric condition.  ____________________________________________   LABS (all labs ordered are listed, but only abnormal results are displayed)  Labs Reviewed  CBC WITH DIFFERENTIAL/PLATELET  COMPREHENSIVE METABOLIC PANEL  LIPASE, BLOOD  PROTIME-INR  MAGNESIUM  TROPONIN I (HIGH SENSITIVITY)  TROPONIN I (HIGH SENSITIVITY)   ____________________________________________  EKG  ED ECG REPORT I, Loleta Rose, the attending physician, personally viewed and interpreted this ECG.  Date: 03/26/2019 EKG Time: 2:06 AM Rate: 74 Rhythm: normal sinus rhythm QRS Axis: normal Intervals: normal ST/T Wave abnormalities: normal Narrative Interpretation: no evidence of acute ischemia  ____________________________________________  RADIOLOGY I, Loleta Rose, personally viewed and evaluated these images (plain radiographs) as part of my medical decision making, as well as reviewing the written report by the radiologist.  ED MD interpretation: No acute abnormalities   ____________________________________________   PROCEDURES   Procedure(s) performed (including Critical Care):  Procedures   ____________________________________________   INITIAL IMPRESSION / MDM / ASSESSMENT AND PLAN / ED COURSE  As part of my medical decision making, I reviewed the following data within the electronic MEDICAL RECORD NUMBER Nursing notes reviewed and  incorporated, Labs reviewed , EKG interpreted , Old chart reviewed, Radiograph reviewed , Notes from prior ED visits and [place] Controlled Substance Database   Differential diagnosis includes, but is not limited to, panic attack, sleep apnea, ACS, pericarditis, pulmonary embolism, pneumonia, aortic dissection.  The patient's EKG is normal with no signs of ischemia.  Vital signs are stable, lab work is all within normal limits including CMP, CBC, and an initial high-sensitivity troponin of 5.  Lipase is normal.  The patient is quite anxious about his symptoms and it could theoretically be attributable to unstable angina but it also may have to do with anxiety.  We decided that we would check the second troponin and then then reassess at that point whether or not he needs outpatient follow-up for further inpatient evaluation and treatment.  He is currently asymptomatic.      Clinical Course as of Mar 26 555  Thu Mar 26, 2019  0300 The patient's second high-sensitivity troponin was 4.  He has had no additional chest pain in the 4 hours that he has been in the emergency department.  I offered admission but explained I did not think it was necessary and he and his wife  want him to go home.  I encourage close outpatient follow-up and gave my usual customary return precautions.  He understands and agrees with the plan.   [CF]    Clinical Course User Index [CF] Hinda Kehr, MD     ____________________________________________  FINAL CLINICAL IMPRESSION(S) / ED DIAGNOSES  Final diagnoses:  Chest pain, unspecified type     MEDICATIONS GIVEN DURING THIS VISIT:  Medications - No data to display   ED Discharge Orders    None      *Please note:  Demico Ploch was evaluated in Emergency Department on 03/26/2019 for the symptoms described in the history of present illness. He was evaluated in the context of the global COVID-19 pandemic, which necessitated consideration that the patient might be at  risk for infection with the SARS-CoV-2 virus that causes COVID-19. Institutional protocols and algorithms that pertain to the evaluation of patients at risk for COVID-19 are in a state of rapid change based on information released by regulatory bodies including the CDC and federal and state organizations. These policies and algorithms were followed during the patient's care in the ED.  Some ED evaluations and interventions may be delayed as a result of limited staffing during the pandemic.*  Note:  This document was prepared using Dragon voice recognition software and may include unintentional dictation errors.   Hinda Kehr, MD 03/26/19 [phone]

## 2019-03-26 NOTE — ED Triage Notes (Signed)
Patient awakened at 0100hrs to chest pain, shortness of breath, and dizziness.

## 2019-03-26 NOTE — Discharge Instructions (Signed)

## 2019-03-29 NOTE — Progress Notes (Signed)
[name] T. Ethan Schweiss, MD Primary Care and Sports Medicine [facility] at [facility] [address] [place], [address] Phone: [phone]  FAX: [phone]  Ethan Jones - 48 y.o. male  MRN [id number]  Date of Birth: 1971-02-07  Visit Date: 03/30/2019  PCP: Hannah Beat, MD  Referred by: Hannah Beat, MD  Chief Complaint  Patient presents with  . Follow-up    ER visit-CP   Subjective:   Ethan Jones is a 48 y.o. very pleasant male patient who presents with the following:  ER follow-up 03/26/2019: Chest pain.  15 min CP like "drownging"  ASA and NTG, no help  Fhx, father with heart problems in 32's GF with heart problems  Wed morning and Thursday early in the AM and woke up extreme pain and going down arm.  Could not catch his breath and someone is sitting on his chest.  Got up and walk walking around the hourse.  Laid back down and hit it about ten times worse.  Through his shoulder and could not get his breath.  Pressure on his chest .  Tests all looked ok, sent him home.    The next day and then started to have some little ones and for now will last only 30 seconds.  Longest was 15-20 minutes.  Thursday night had a pressure in his stomach.  On Friday went to a business meeting.  Then could feel some pressure of  [place]  [place] and the bar came down and he felt panic.   At the time in the past, he was going [place] the [place].  Felt a little different.   Right now, getting him everyday.   Not that bad right now. Like claustrophobia.  Woke up a few times   Past Medical History, Surgical History, Social History, Family History, Problem List, Medications, and Allergies have been reviewed and updated if relevant.  Patient Active Problem List   Diagnosis Date Noted  . 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  No past surgical history on file.  Social History   Socioeconomic History  .  Marital status: Married    Spouse name: Not on file  . Number of children: Not on file  . Years of education: Not on file  . Highest education level: Not on file  Occupational History  . Not on file  Social Needs  . Financial resource strain: Not on file  . Food insecurity    Worry: Not on file    Inability: Not on file  . Transportation needs    Medical: Not on file    Non-medical: Not on file  Tobacco Use  . Smoking status: Never Smoker  . Smokeless tobacco: Never Used  Substance and Sexual Activity  . Alcohol use: No  . Drug use: No  . Sexual activity: Not on file  Lifestyle  . Physical activity    Days per week: Not on file    Minutes per session: Not on file  . Stress: Not on file  Relationships  . Social Musician on phone: Not on file    Gets together: Not on file    Attends religious service: Not on file    Active member of club or organization: Not on file    Attends meetings of clubs or organizations: Not on file    Relationship status: Not on file  . Intimate partner violence  Fear of current or ex partner: Not on file    Emotionally abused: Not on file    Physically abused: Not on file    Forced sexual activity: Not on file  Other Topics Concern  . Not on file  Social History Narrative  . Not on file    No family history on file.  No Known Allergies  Medication list reviewed and updated in full in [place] Link.   GEN: No acute illnesses, no fevers, chills. GI: No n/v/d, eating normally Pulm: No SOB Interactive and getting along well at home.  Otherwise, ROS is as per the HPI.  Objective:   BP 100/80   Pulse 76   Temp 98.4 F (36.9 C) (Temporal)   Ht 6\' 2"  (1.88 m)   Wt 207 lb 8 oz (94.1 kg)   SpO2 98%   BMI 26.64 kg/m   GEN: WDWN, NAD, Non-toxic, A & O x 3 HEENT: Atraumatic, Normocephalic. Neck supple. No masses, No LAD. Ears and Nose: No external deformity. CV: RRR, No M/G/R. No JVD. No thrill. No extra heart  sounds. PULM: CTA B, no wheezes, crackles, rhonchi. No retractions. No resp. distress. No accessory muscle use. EXTR: No c/c/e NEURO Normal gait.  PSYCH: Normally interactive. Conversant. Not depressed or anxious appearing.  Calm demeanor.   Laboratory and Imaging Data: Results for orders placed or performed during the hospital encounter of 03/26/19  CBC with Differential/Platelet  Result Value Ref Range   WBC 8.3 4.0 - 10.5 K/uL   RBC 5.34 4.22 - 5.81 MIL/uL   Hemoglobin 15.2 13.0 - 17.0 g/dL   HCT 16.145.5 09.639.0 - 04.552.0 %   MCV 85.2 80.0 - 100.0 fL   MCH 28.5 26.0 - 34.0 pg   MCHC 33.4 30.0 - 36.0 g/dL   RDW 40.912.3 81.111.5 - 91.415.5 %   Platelets 230 150 - 400 K/uL   nRBC 0.0 0.0 - 0.2 %   Neutrophils Relative % 55 %   Neutro Abs 4.6 1.7 - 7.7 K/uL   Lymphocytes Relative 33 %   Lymphs Abs 2.8 0.7 - 4.0 K/uL   Monocytes Relative 7 %   Monocytes Absolute 0.6 0.1 - 1.0 K/uL   Eosinophils Relative 4 %   Eosinophils Absolute 0.3 0.0 - 0.5 K/uL   Basophils Relative 1 %   Basophils Absolute 0.1 0.0 - 0.1 K/uL   Immature Granulocytes 0 %   Abs Immature Granulocytes 0.02 0.00 - 0.07 K/uL  Comprehensive metabolic panel  Result Value Ref Range   Sodium 141 135 - 145 mmol/L   Potassium 3.5 3.5 - 5.1 mmol/L   Chloride 109 98 - 111 mmol/L   CO2 26 22 - 32 mmol/L   Glucose, Bld 97 70 - 99 mg/dL   BUN 13 6 - 20 mg/dL   Creatinine, Ser 7.821.19 0.61 - 1.24 mg/dL   Calcium 9.2 8.9 - 95.610.3 mg/dL   Total Protein 6.9 6.5 - 8.1 g/dL   Albumin 4.1 3.5 - 5.0 g/dL   AST 23 15 - 41 U/L   ALT 29 0 - 44 U/L   Alkaline Phosphatase 65 38 - 126 U/L   Total Bilirubin 0.4 0.3 - 1.2 mg/dL   GFR calc non Af Amer >60 >60 mL/min   GFR calc Af Amer >60 >60 mL/min   Anion gap 6 5 - 15  Lipase, blood  Result Value Ref Range   Lipase 28 11 - 51 U/L  Protime-INR  Result Value Ref Range  Prothrombin Time 12.9 11.4 - 15.2 seconds   INR 1.0 0.8 - 1.2  Magnesium  Result Value Ref Range   Magnesium 2.1 1.7 - 2.4 mg/dL   Troponin I (High Sensitivity)  Result Value Ref Range   Troponin I (High Sensitivity) 5 <18 ng/L  Troponin I (High Sensitivity)  Result Value Ref Range   Troponin I (High Sensitivity) 4 <18 ng/L     Assessment and Plan:     ICD-10-CM   1. Other chest pain  R07.89   2. Need for influenza vaccination  Z23 Flu Vaccine QUAD 6+ mos PF IM (Fluarix Quad PF)  3. Anxiety  F41.9    CP, neg EKG and CE.  Has cards f/u tomorrow with Dr. Josefa Half.  ? If panic attacks.  Trial of scheduled klonopin for a few days, if helps, convert to ssri for a few months  Follow-up: No follow-ups on file.  Meds ordered this encounter  Medications  . clonazePAM (KLONOPIN) 0.5 MG tablet    Sig: Take 0.5-1 tablets (0.25-0.5 mg total) by mouth 2 (two) times daily.    Dispense:  20 tablet    Refill:  0   Orders Placed This Encounter  Procedures  . Flu Vaccine QUAD 6+ mos PF IM (Fluarix Quad PF)    Signed,  Nilo Fallin T. Asenath Balash, MD   Outpatient Encounter Medications as of 03/30/2019  Medication Sig  . clindamycin (CLEOCIN T) 1 % external solution APP TO NECK HS UTD  . clonazePAM (KLONOPIN) 0.5 MG tablet Take 0.5-1 tablets (0.25-0.5 mg total) by mouth 2 (two) times daily.  . [DISCONTINUED] benzonatate (TESSALON) 200 MG capsule Take one cap TID PRN cough  . [DISCONTINUED] oseltamivir (TAMIFLU) 75 MG capsule Take 1 capsule (75 mg total) by mouth every 12 (twelve) hours.   No facility-administered encounter medications on file as of 03/30/2019.

## 2019-03-30 ENCOUNTER — Ambulatory Visit: Payer: Managed Care, Other (non HMO) | Admitting: Family Medicine

## 2019-03-30 ENCOUNTER — Other Ambulatory Visit: Payer: Self-pay

## 2019-03-30 VITALS — BP 100/80 | HR 76 | Temp 98.4°F | Ht 74.0 in | Wt 207.5 lb

## 2019-03-30 DIAGNOSIS — F419 Anxiety disorder, unspecified: Secondary | ICD-10-CM | POA: Diagnosis not present

## 2019-03-30 DIAGNOSIS — Z23 Encounter for immunization: Secondary | ICD-10-CM | POA: Diagnosis not present

## 2019-03-30 DIAGNOSIS — R0789 Other chest pain: Secondary | ICD-10-CM

## 2019-03-30 MED ORDER — CLONAZEPAM 0.5 MG PO TABS: 0.2500 mg | ORAL_TABLET | Freq: Two times a day (BID) | ORAL | 0 refills | Status: DC

## 2019-03-30 NOTE — Patient Instructions (Signed)
Call me on Friday to let me know if the anxiety medicine works.

## 2019-03-31 ENCOUNTER — Encounter: Payer: Self-pay | Admitting: Family Medicine

## 2019-04-07 ENCOUNTER — Telehealth: Payer: Self-pay | Admitting: Family Medicine

## 2019-04-07 NOTE — Telephone Encounter (Signed)
Good, the clonazepam is only ok as a short-term med (a lot diagnostic)  I think going on a few months of an SSRI is the best idea.  I usually use paxil for people with mostly anxiety.  Girard?  F/u video visit in 6 weeks.

## 2019-04-07 NOTE — Telephone Encounter (Signed)
Left message for Nicki to return my call.

## 2019-04-07 NOTE — Telephone Encounter (Signed)
Adorian notified as instructed by telephone.  He is agreeable to starting Paxil.  He will call back to schedule his 6 week follow up.  He also wanted to let Dr. Lorelei Pont know he is schedule for a stress test on 04/16/2019.

## 2019-04-07 NOTE — Telephone Encounter (Signed)
Ethan Jones called and said Dr Lorelei Pont wanted Ethan Jones to call back and let him know how his medication for panic attacks is doing. Ethan Jones said he's taking 1/2 a pill and it's doing okay and Ethan Jones's going to try taking a full pill this week and see how it does.

## 2019-04-08 MED ORDER — PAROXETINE HCL 20 MG PO TABS: 20.0000 mg | ORAL_TABLET | Freq: Every day | ORAL | 3 refills | Status: DC

## 2019-04-08 NOTE — Telephone Encounter (Signed)
done

## 2019-08-10 ENCOUNTER — Other Ambulatory Visit: Payer: Self-pay | Admitting: *Deleted

## 2019-08-10 MED ORDER — PAROXETINE HCL 20 MG PO TABS: 20.0000 mg | ORAL_TABLET | Freq: Every day | ORAL | 3 refills | Status: DC

## 2019-08-10 NOTE — Telephone Encounter (Signed)
Last office visit 03/30/2019.  Last refilled 04/08/2019 for #30 with 3 refills.  Phone note from 04/07/2019 states to schedule video visit in 6 weeks.  No future appointments.  Refill?

## 2019-08-21 DIAGNOSIS — R079 Chest pain, unspecified: Secondary | ICD-10-CM | POA: Diagnosis not present

## 2019-08-21 DIAGNOSIS — R0602 Shortness of breath: Secondary | ICD-10-CM | POA: Diagnosis not present

## 2019-11-09 ENCOUNTER — Ambulatory Visit: Payer: Self-pay | Attending: Internal Medicine

## 2019-11-09 DIAGNOSIS — Z23 Encounter for immunization: Secondary | ICD-10-CM

## 2019-11-09 NOTE — Progress Notes (Signed)
   Covid-19 Vaccination Clinic  Name:  Jaysen Wey    MRN: [id number] DOB: [birth year]  11/09/2019  Mr. Koska was observed post Covid-19 immunization for 15 minutes without incident. He was provided with Vaccine Information Sheet and instruction to access the V-Safe system.   Mr. Orourke was instructed to call 911 with any severe reactions post vaccine: Marland Kitchen Difficulty breathing  . Swelling of face and throat  . A fast heartbeat  . A bad rash all over body  . Dizziness and weakness   Immunizations Administered    Name Date Dose VIS Date Route   Pfizer COVID-19 Vaccine 11/09/2019  8:11 AM 0.3 mL 07/10/2019 Intramuscular   Manufacturer: [employer], [employer]   Lot: [id number]   NDC: [address]-6

## 2019-12-01 ENCOUNTER — Ambulatory Visit: Payer: Self-pay | Attending: Internal Medicine

## 2019-12-01 DIAGNOSIS — Z23 Encounter for immunization: Secondary | ICD-10-CM

## 2019-12-01 NOTE — Progress Notes (Signed)
   Covid-19 Vaccination Clinic  Name:  Owin Vignola    MRN: [id number] DOB: [birth year]  12/01/2019  Mr. Shear was observed post Covid-19 immunization for 15 minutes without incident. He was provided with Vaccine Information Sheet and instruction to access the V-Safe system.   Mr. Dimichele was instructed to call 911 with any severe reactions post vaccine: Marland Kitchen Difficulty breathing  . Swelling of face and throat  . A fast heartbeat  . A bad rash all over body  . Dizziness and weakness   Immunizations Administered    Name Date Dose VIS Date Route   Pfizer COVID-19 Vaccine 12/01/2019  1:14 PM 0.3 mL 09/23/2018 Intramuscular   Manufacturer: [employer], [employer]   Lot: [id number]   NDC: [address]-8

## 2019-12-15 DIAGNOSIS — Z85828 Personal history of other malignant neoplasm of skin: Secondary | ICD-10-CM | POA: Diagnosis not present

## 2019-12-15 DIAGNOSIS — L578 Other skin changes due to chronic exposure to nonionizing radiation: Secondary | ICD-10-CM | POA: Diagnosis not present

## 2019-12-15 DIAGNOSIS — Z872 Personal history of diseases of the skin and subcutaneous tissue: Secondary | ICD-10-CM | POA: Diagnosis not present

## 2019-12-15 DIAGNOSIS — Z859 Personal history of malignant neoplasm, unspecified: Secondary | ICD-10-CM | POA: Diagnosis not present

## 2019-12-21 ENCOUNTER — Other Ambulatory Visit: Payer: Self-pay | Admitting: Family Medicine

## 2019-12-21 NOTE — Telephone Encounter (Signed)
Please schedule CPE with fasting labs prior with Dr. Copland.  

## 2020-01-08 ENCOUNTER — Ambulatory Visit (INDEPENDENT_AMBULATORY_CARE_PROVIDER_SITE_OTHER)
Admission: RE | Admit: 2020-01-08 | Discharge: 2020-01-08 | Disposition: A | Discharge status: Home / Self Care | Payer: BC Managed Care – PPO | Source: Ambulatory Visit

## 2020-01-08 DIAGNOSIS — J069 Acute upper respiratory infection, unspecified: Secondary | ICD-10-CM

## 2020-01-08 MED ORDER — CETIRIZINE HCL 10 MG PO CAPS: 10.0000 mg | ORAL_CAPSULE | Freq: Every day | ORAL | 0 refills | Status: DC

## 2020-01-08 MED ORDER — FLUTICASONE PROPIONATE 50 MCG/ACT NA SUSP: 1.0000 | Freq: Every day | NASAL | 0 refills | Status: DC

## 2020-01-08 NOTE — ED Provider Notes (Signed)
Virtual Visit via Video Note:  Ethan Jones  initiated request for Telemedicine visit with [facility] Urgent [facility] team. I connected with Ethan Jones  on 01/08/2020 at 7:44 PM  for a synchronized telemedicine visit using a video enabled HIPPA compliant telemedicine application. I verified that I am speaking with Ethan Jones  using two identifiers. Ethan Jones C Ethan Mcdermid, PA-C  was physically located in a [facility] Urgent care site and Ethan Jones was located at a different location.   The limitations of evaluation and management by telemedicine as well as the availability of in-person appointments were discussed. Patient was informed that he  may incur a bill ( including co-pay) for this virtual visit encounter. Ethan Jones  expressed understanding and gave verbal consent to proceed with virtual visit.     History of Present Illness:Ethan Jones  is a 49 y.o. male presents for evaluation of URI symptoms.  Patient reports that beginning last night he began to develop congestion, drainage and pressure that he has also been feeling in his ears.  He reports associated chills and low-grade fever up to 99.9.  He has been using Tylenol cold and sinus without relief.  He is fully vaccinated.  He denies any chest pain shortness of breath or cough.     No Known Allergies   No past medical history on file.   Social History   Tobacco Use  . Smoking status: Never Smoker  . Smokeless tobacco: Never Used  Substance Use Topics  . Alcohol use: No  . Drug use: No        Observations/Objective: Physical Exam Constitutional:      Appearance: Normal appearance.  HENT:     Head: Normocephalic and atraumatic.     Nose: Nose normal.  Eyes:     Extraocular Movements: Extraocular movements intact.  Pulmonary:     Effort: Pulmonary effort is normal. No respiratory distress.     Comments: Speaking in full sentences, no coughing Neurological:     General: No focal deficit present.     Mental Status:  He is alert and oriented to person, place, and time.     Comments: Speech clear, face symmetric       Assessment and Plan:  Viral URI with cough  URI symptoms x1 day, likely viral etiology and recommending symptomatic and supportive care.  Did recommend Covid testing.  Come in for nurse visit in order to rule out.  Recommended cetirizine and Flonase to help with sinus pressure congestion drainage and ear pain.  Discussed strict return precautions. Patient verbalized understanding and is agreeable with plan.    Follow Up Instructions:     I discussed the assessment and treatment plan with the patient. The patient was provided an opportunity to ask questions and all were answered. The patient agreed with the plan and demonstrated an understanding of the instructions.   The patient was advised to call back or seek an in-person evaluation if the symptoms worsen or if the condition fails to improve as anticipated.      Ethan Dawes, PA-C  01/08/2020 7:44 PM        Patterson Hammersmith C, PA-C 01/09/20 1004

## 2020-01-18 NOTE — Telephone Encounter (Signed)
Lvm asking pt to call office 

## 2020-03-21 ENCOUNTER — Telehealth: Payer: Self-pay | Admitting: Family Medicine

## 2020-03-21 NOTE — Telephone Encounter (Signed)
Please schedule CPE with fasting labs prior with Dr. Copland.  

## 2020-03-22 NOTE — Telephone Encounter (Signed)
Left message asking pt to call office  °

## 2020-03-24 NOTE — Telephone Encounter (Signed)
Labs 9/15 cpx 9/27 Pt aware

## 2020-04-11 ENCOUNTER — Other Ambulatory Visit: Payer: Self-pay | Admitting: Family Medicine

## 2020-04-11 DIAGNOSIS — R5383 Other fatigue: Secondary | ICD-10-CM

## 2020-04-11 DIAGNOSIS — Z125 Encounter for screening for malignant neoplasm of prostate: Secondary | ICD-10-CM

## 2020-04-11 DIAGNOSIS — Z13220 Encounter for screening for lipoid disorders: Secondary | ICD-10-CM

## 2020-04-11 DIAGNOSIS — Z131 Encounter for screening for diabetes mellitus: Secondary | ICD-10-CM

## 2020-04-13 ENCOUNTER — Other Ambulatory Visit (INDEPENDENT_AMBULATORY_CARE_PROVIDER_SITE_OTHER): Payer: BC Managed Care – PPO

## 2020-04-13 ENCOUNTER — Other Ambulatory Visit: Payer: Self-pay

## 2020-04-13 DIAGNOSIS — Z13220 Encounter for screening for lipoid disorders: Secondary | ICD-10-CM | POA: Diagnosis not present

## 2020-04-13 DIAGNOSIS — Z131 Encounter for screening for diabetes mellitus: Secondary | ICD-10-CM

## 2020-04-13 DIAGNOSIS — Z125 Encounter for screening for malignant neoplasm of prostate: Secondary | ICD-10-CM

## 2020-04-13 DIAGNOSIS — R5383 Other fatigue: Secondary | ICD-10-CM

## 2020-04-13 LAB — HEPATIC FUNCTION PANEL
ALT: 24 U/L (ref 0–53)
AST: 21 U/L (ref 0–37)
Albumin: 4.2 g/dL (ref 3.5–5.2)
Alkaline Phosphatase: 73 U/L (ref 39–117)
Bilirubin, Direct: 0 mg/dL (ref 0.0–0.3)
Total Bilirubin: 0.3 mg/dL (ref 0.2–1.2)
Total Protein: 6.8 g/dL (ref 6.0–8.3)

## 2020-04-13 LAB — LIPID PANEL
Cholesterol: 186 mg/dL (ref 0–200)
HDL: 34.5 mg/dL — ABNORMAL LOW (ref >39.00)
NonHDL: 151.47
Total CHOL/HDL Ratio: 5
Triglycerides: 292 mg/dL — ABNORMAL HIGH (ref 0.0–149.0)
VLDL: 58.4 mg/dL — ABNORMAL HIGH (ref 0.0–40.0)

## 2020-04-13 LAB — BASIC METABOLIC PANEL
BUN: 16 mg/dL (ref 6–23)
CO2: 27 mEq/L (ref 19–32)
Calcium: 8.8 mg/dL (ref 8.4–10.5)
Chloride: 107 mEq/L (ref 96–112)
Creatinine, Ser: 1.21 mg/dL (ref 0.40–1.50)
GFR: 63.99 mL/min (ref >60.00)
Glucose, Bld: 94 mg/dL (ref 70–99)
Potassium: 3.9 mEq/L (ref 3.5–5.1)
Sodium: 140 mEq/L (ref 135–145)

## 2020-04-13 LAB — CBC WITH DIFFERENTIAL/PLATELET
Basophils Absolute: 0.1 10*3/uL (ref 0.0–0.1)
Basophils Relative: 0.9 % (ref 0.0–3.0)
Eosinophils Absolute: 0.4 10*3/uL (ref 0.0–0.7)
Eosinophils Relative: 4.4 % (ref 0.0–5.0)
HCT: 44.7 % (ref 39.0–52.0)
Hemoglobin: 14.8 g/dL (ref 13.0–17.0)
Lymphocytes Relative: 19.9 % (ref 12.0–46.0)
Lymphs Abs: 1.7 10*3/uL (ref 0.7–4.0)
MCHC: 33.2 g/dL (ref 30.0–36.0)
MCV: 88.3 fl (ref 78.0–100.0)
Monocytes Absolute: 0.6 10*3/uL (ref 0.1–1.0)
Monocytes Relative: 7 % (ref 3.0–12.0)
Neutro Abs: 5.8 10*3/uL (ref 1.4–7.7)
Neutrophils Relative %: 67.8 % (ref 43.0–77.0)
Platelets: 211 10*3/uL (ref 150.0–400.0)
RBC: 5.07 Mil/uL (ref 4.22–5.81)
RDW: 13.8 % (ref 11.5–15.5)
WBC: 8.5 10*3/uL (ref 4.0–10.5)

## 2020-04-13 LAB — HEMOGLOBIN A1C: Hgb A1c MFr Bld: 5.5 % (ref 4.6–6.5)

## 2020-04-13 LAB — LDL CHOLESTEROL, DIRECT: Direct LDL: 121 mg/dL

## 2020-04-14 LAB — PSA, TOTAL WITH REFLEX TO PSA, FREE: PSA, Total: 0.8 ng/mL (ref <=4.0)

## 2020-04-21 ENCOUNTER — Other Ambulatory Visit: Payer: Self-pay | Admitting: Family Medicine

## 2020-04-21 DIAGNOSIS — D485 Neoplasm of uncertain behavior of skin: Secondary | ICD-10-CM | POA: Diagnosis not present

## 2020-04-25 ENCOUNTER — Other Ambulatory Visit: Payer: Self-pay

## 2020-04-25 ENCOUNTER — Ambulatory Visit (INDEPENDENT_AMBULATORY_CARE_PROVIDER_SITE_OTHER): Payer: BC Managed Care – PPO | Admitting: Family Medicine

## 2020-04-25 ENCOUNTER — Encounter: Payer: Self-pay | Admitting: Family Medicine

## 2020-04-25 VITALS — BP 102/80 | HR 75 | Temp 98.1°F | Ht 73.75 in | Wt 217.5 lb

## 2020-04-25 DIAGNOSIS — Z0000 Encounter for general adult medical examination without abnormal findings: Secondary | ICD-10-CM | POA: Diagnosis not present

## 2020-04-25 DIAGNOSIS — Z23 Encounter for immunization: Secondary | ICD-10-CM | POA: Diagnosis not present

## 2020-04-25 DIAGNOSIS — Z1211 Encounter for screening for malignant neoplasm of colon: Secondary | ICD-10-CM

## 2020-04-25 DIAGNOSIS — Z800 Family history of malignant neoplasm of digestive organs: Secondary | ICD-10-CM

## 2020-04-25 NOTE — Progress Notes (Signed)
Cherrell Maybee T. Xochilt Conant, MD, CAQ Sports Medicine  Primary Care and Sports Medicine [facility] [address] [place], [address]  Phone: [phone]  FAX: [phone]  Maxamillion Banas - 49 y.o. male  MRN [id number]  Date of Birth: [birth year]  Date: 04/25/2020  PCP: Hannah Beat, MD  Referral: Hannah Beat, MD  Chief Complaint  Patient presents with  . Annual Exam    This visit occurred during the SARS-CoV-2 public health emergency.  Safety protocols were in place, including screening questions prior to the visit, additional usage of staff PPE, and extensive cleaning of exam room while observing appropriate contact time as indicated for disinfecting solutions.   Patient Care Team: Hannah Beat, MD as PCP - General Subjective:   Zannie Runkle is a 49 y.o. pleasant patient who presents with the following:  Preventative Health Maintenance Visit:  Health Maintenance Summary Reviewed and updated, unless pt declines services.  Tobacco History Reviewed. Alcohol: No concerns, no excessive use Exercise Habits: Some activity, rec at least 30 mins 5 times a week - with his kids STD concerns: no risk or activity to increase risk Drug Use: None  Had a panic attack, also with some congested.  Noted with some ear discomfort.  Has not had a major panic. Minor ever few months.   Has been getting basketball, [place] will race motocross. Some activity with that.    Health Maintenance  Topic Date Due  . Hepatitis C Screening  Never done  . TETANUS/TDAP  05/14/2023  . INFLUENZA VACCINE  Completed  . COVID-19 Vaccine  Completed  . HIV Screening  Completed   Immunization History  Administered Date(s) Administered  . Influenza,inj,Quad PF,6+ Mos 04/25/2017, 04/28/2018, 03/30/2019, 04/25/2020  . Influenza,inj,quad, With Preservative 07/11/2015  . PFIZER SARS-COV-2 Vaccination 11/09/2019, 12/01/2019  . Tdap 05/13/2013   Patient Active  Problem List   Diagnosis Date Noted  . 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 No pertinent past medical history.  History reviewed. No pertinent surgical history.  History reviewed. No pertinent family history.  Past Medical History, Surgical History, Social History, Family History, Problem List, Medications, and Allergies have been reviewed and updated if relevant.  Review of Systems: Pertinent positives are listed above.  Otherwise, a full 14 point review of systems has been done in full and it is negative except where it is noted positive.  Objective:   BP 102/80   Pulse 75   Temp 98.1 F (36.7 C) (Temporal)   Ht 6' 1.75" (1.873 m)   Wt 217 lb 8 oz (98.7 kg)   SpO2 95%   BMI 28.11 kg/m  Ideal Body Weight: Weight in (lb) to have BMI = 25: 193  Ideal Body Weight: Weight in (lb) to have BMI = 25: 193 No exam data present Depression screen [facility] 2/9 04/25/2020 04/25/2017  Decreased Interest 0 0  Down, Depressed, Hopeless 0 0  PHQ - 2 Score 0 0     GEN: well developed, well nourished, no acute distress Eyes: conjunctiva and lids normal, PERRLA, EOMI ENT: TM clear, nares clear, oral exam WNL Neck: supple, no lymphadenopathy, no thyromegaly, no JVD Pulm: clear to auscultation and percussion, respiratory effort normal CV: regular rate and rhythm, S1-S2, no murmur, rub or gallop, no bruits, peripheral pulses normal and symmetric, no cyanosis, clubbing, edema or varicosities GI: soft, non-tender; no hepatosplenomegaly, masses; active bowel sounds all quadrants GU: no hernia, testicular mass, penile discharge Lymph: no cervical,  axillary or inguinal adenopathy MSK: gait normal, muscle tone and strength WNL, no joint swelling, effusions, discoloration, crepitus  SKIN: clear, good turgor, color WNL, no rashes, lesions, or ulcerations Neuro: normal mental status, normal strength, sensation, and motion Psych: alert; oriented to person, place and time,  normally interactive and not anxious or depressed in appearance.  All labs reviewed with patient. Results for orders placed or performed in visit on 04/13/20  PSA, Total with Reflex to PSA, Free  Result Value Ref Range   PSA, Total 0.8 < OR = 4.0 ng/mL  Hemoglobin A1c  Result Value Ref Range   Hgb A1c MFr Bld 5.5 4.6 - 6.5 %  Hepatic function panel  Result Value Ref Range   Total Bilirubin 0.3 0.2 - 1.2 mg/dL   Bilirubin, Direct 0.0 0.0 - 0.3 mg/dL   Alkaline Phosphatase 73 39 - 117 U/L   AST 21 0 - 37 U/L   ALT 24 0 - 53 U/L   Total Protein 6.8 6.0 - 8.3 g/dL   Albumin 4.2 3.5 - 5.2 g/dL  CBC with Differential/Platelet  Result Value Ref Range   WBC 8.5 4.0 - 10.5 K/uL   RBC 5.07 4.22 - 5.81 Mil/uL   Hemoglobin 14.8 13.0 - 17.0 g/dL   HCT 00.9 39 - 52 %   MCV 88.3 78.0 - 100.0 fl   MCHC 33.2 30.0 - 36.0 g/dL   RDW 38.1 82.9 - 93.7 %   Platelets 211.0 150 - 400 K/uL   Neutrophils Relative % 67.8 43 - 77 %   Lymphocytes Relative 19.9 12 - 46 %   Monocytes Relative 7.0 3 - 12 %   Eosinophils Relative 4.4 0 - 5 %   Basophils Relative 0.9 0 - 3 %   Neutro Abs 5.8 1.4 - 7.7 K/uL   Lymphs Abs 1.7 0.7 - 4.0 K/uL   Monocytes Absolute 0.6 0 - 1 K/uL   Eosinophils Absolute 0.4 0 - 0 K/uL   Basophils Absolute 0.1 0 - 0 K/uL  Basic metabolic panel  Result Value Ref Range   Sodium 140 135 - 145 mEq/L   Potassium 3.9 3.5 - 5.1 mEq/L   Chloride 107 96 - 112 mEq/L   CO2 27 19 - 32 mEq/L   Glucose, Bld 94 70 - 99 mg/dL   BUN 16 6 - 23 mg/dL   Creatinine, Ser 1.69 0.40 - 1.50 mg/dL   GFR 67.89 >38.10 mL/min   Calcium 8.8 8.4 - 10.5 mg/dL  Lipid panel  Result Value Ref Range   Cholesterol 186 0 - 200 mg/dL   Triglycerides 175.1 (H) 0 - 149 mg/dL   HDL 02.58 (L) >52.77 mg/dL   VLDL 82.4 (H) 0.0 - 23.5 mg/dL   Total CHOL/HDL Ratio 5    NonHDL 151.47   LDL cholesterol, direct  Result Value Ref Range   Direct LDL 121.0 mg/dL    Assessment and Plan:     ICD-10-CM   1.  Healthcare maintenance  Z00.00   2. Need for influenza vaccination  Z23 Flu Vaccine QUAD 6+ mos PF IM (Fluarix Quad PF)  3. Screen for colon cancer  Z12.11 Ambulatory referral to Gastroenterology  4. Family history of colon cancer  Z80.0 Ambulatory referral to Gastroenterology   Work on diet and fitness, goal weight is 200 for him f hx of colon ca - screening at age 31  Health Maintenance Exam: The patient's preventative maintenance and recommended screening tests for an annual wellness exam  were reviewed in full today. Brought up to date unless services declined.  Counselled on the importance of diet, exercise, and its role in overall health and mortality. The patient's FH and SH was reviewed, including their home life, tobacco status, and drug and alcohol status.  Follow-up in 1 year for physical exam or additional follow-up below.  Follow-up: No follow-ups on file. Or follow-up in 1 year if not noted.  No orders of the defined types were placed in this encounter.  Medications Discontinued During This Encounter  Medication Reason  . Cetirizine HCl 10 MG CAPS Completed Course  . clonazePAM (KLONOPIN) 0.5 MG tablet Completed Course  . fluticasone (FLONASE) 50 MCG/ACT nasal spray Completed Course   Orders Placed This Encounter  Procedures  . Flu Vaccine QUAD 6+ mos PF IM (Fluarix Quad PF)  . Ambulatory referral to Gastroenterology    Signed,  Karleen Hampshire T. Umaima Scholten, MD   Allergies as of 04/25/2020   No Known Allergies     Medication List       Accurate as of April 25, 2020  9:18 AM. If you have any questions, ask your nurse or doctor.        STOP taking these medications   Cetirizine HCl 10 MG Caps Stopped by: Hannah Beat, MD   clonazePAM 0.5 MG tablet Commonly known as: KlonoPIN Stopped by: Hannah Beat, MD   fluticasone 50 MCG/ACT nasal spray Commonly known as: FLONASE Stopped by: Hannah Beat, MD     TAKE these medications   clindamycin 1 %  external solution Commonly known as: CLEOCIN T APP TO NECK HS UTD   PARoxetine 20 MG tablet Commonly known as: PAXIL TAKE 1 TABLET(20 MG) BY MOUTH DAILY

## 2020-05-09 ENCOUNTER — Other Ambulatory Visit: Payer: Self-pay

## 2020-05-09 ENCOUNTER — Telehealth (INDEPENDENT_AMBULATORY_CARE_PROVIDER_SITE_OTHER): Payer: Self-pay | Admitting: Gastroenterology

## 2020-05-09 DIAGNOSIS — Z1211 Encounter for screening for malignant neoplasm of colon: Secondary | ICD-10-CM

## 2020-05-09 DIAGNOSIS — Z8371 Family history of colonic polyps: Secondary | ICD-10-CM

## 2020-05-09 MED ORDER — NA SULFATE-K SULFATE-MG SULF 17.5-3.13-1.6 GM/177ML PO SOLN: 1.0000 | Freq: Once | ORAL | 0 refills | Status: AC

## 2020-05-09 NOTE — Progress Notes (Signed)
Gastroenterology Pre-Procedure Review  Request Date: Tuesday 05/24/20 Requesting Physician: Dr. Marius Ditch  PATIENT REVIEW QUESTIONS: The patient responded to the following health history questions as indicated:    1. Are you having any GI issues? no 2. Do you have a personal history of Polyps? no 3. Do you have a family history of Colon Cancer or Polyps? Yes grandmother colon cancer, sister colon polyps 4. Diabetes Mellitus? no 5. Joint replacements in the past 12 months?no 6. Major health problems in the past 3 months?no 7. Any artificial heart valves, MVP, or defibrillator?no    MEDICATIONS & ALLERGIES:    Patient reports the following regarding taking any anticoagulation/antiplatelet therapy:   Plavix, Coumadin, Eliquis, Xarelto, Lovenox, Pradaxa, Brilinta, or Effient? no Aspirin? no  Patient confirms/reports the following medications:  Current Outpatient Medications  Medication Sig Dispense Refill  . clindamycin (CLEOCIN T) 1 % external solution APP TO NECK HS UTD    . PARoxetine (PAXIL) 20 MG tablet TAKE 1 TABLET(20 MG) BY MOUTH DAILY 30 tablet 0  . Na Sulfate-K Sulfate-Mg Sulf 17.5-3.13-1.6 GM/177ML SOLN Take 1 kit by mouth once for 1 dose. 354 mL 0   No current facility-administered medications for this visit.    Patient confirms/reports the following allergies:  No Known Allergies  No orders of the defined types were placed in this encounter.   AUTHORIZATION INFORMATION Primary Insurance: 1D#: Group #:  Secondary Insurance: 1D#: Group #:  SCHEDULE INFORMATION: Date: Tuesday  05/24/20 Time: Location:[facility]

## 2020-05-19 ENCOUNTER — Other Ambulatory Visit: Payer: Self-pay

## 2020-05-19 ENCOUNTER — Other Ambulatory Visit: Payer: Self-pay | Admitting: Family Medicine

## 2020-05-19 ENCOUNTER — Telehealth: Payer: Self-pay | Admitting: Gastroenterology

## 2020-05-19 DIAGNOSIS — Z1211 Encounter for screening for malignant neoplasm of colon: Secondary | ICD-10-CM

## 2020-05-20 ENCOUNTER — Other Ambulatory Visit: Payer: BC Managed Care – PPO

## 2020-06-02 ENCOUNTER — Other Ambulatory Visit: Payer: BC Managed Care – PPO

## 2020-06-06 ENCOUNTER — Other Ambulatory Visit
Admission: RE | Admit: 2020-06-06 | Discharge: 2020-06-06 | Disposition: A | Discharge status: Home / Self Care | Payer: BC Managed Care – PPO | Source: Ambulatory Visit | Attending: Gastroenterology | Admitting: Gastroenterology

## 2020-06-06 ENCOUNTER — Other Ambulatory Visit: Payer: Self-pay

## 2020-06-06 DIAGNOSIS — Z20822 Contact with and (suspected) exposure to covid-19: Secondary | ICD-10-CM | POA: Insufficient documentation

## 2020-06-06 DIAGNOSIS — Z1211 Encounter for screening for malignant neoplasm of colon: Secondary | ICD-10-CM | POA: Diagnosis not present

## 2020-06-06 DIAGNOSIS — Z01812 Encounter for preprocedural laboratory examination: Secondary | ICD-10-CM | POA: Insufficient documentation

## 2020-06-06 LAB — SARS CORONAVIRUS 2 (TAT 6-24 HRS): SARS Coronavirus 2: NEGATIVE

## 2020-06-08 ENCOUNTER — Encounter: Payer: Self-pay | Admitting: Gastroenterology

## 2020-06-08 ENCOUNTER — Ambulatory Visit: Payer: BC Managed Care – PPO | Admitting: Certified Registered"

## 2020-06-08 ENCOUNTER — Encounter
Admission: RE | Disposition: A | Discharge status: Home / Self Care | Payer: Self-pay | Source: Home / Self Care | Attending: Gastroenterology

## 2020-06-08 ENCOUNTER — Ambulatory Visit
Admission: RE | Admit: 2020-06-08 | Discharge: 2020-06-08 | Disposition: A | Discharge status: Home / Self Care | Payer: BC Managed Care – PPO | Attending: Gastroenterology | Admitting: Gastroenterology

## 2020-06-08 DIAGNOSIS — Z20822 Contact with and (suspected) exposure to covid-19: Secondary | ICD-10-CM | POA: Insufficient documentation

## 2020-06-08 DIAGNOSIS — Z8371 Family history of colonic polyps: Secondary | ICD-10-CM

## 2020-06-08 DIAGNOSIS — Z83719 Family history of colon polyps, unspecified: Secondary | ICD-10-CM

## 2020-06-08 DIAGNOSIS — Z1211 Encounter for screening for malignant neoplasm of colon: Secondary | ICD-10-CM

## 2020-06-08 HISTORY — PX: COLONOSCOPY WITH PROPOFOL: SHX5780

## 2020-06-08 SURGERY — COLONOSCOPY WITH PROPOFOL
Anesthesia: General

## 2020-06-08 MED ORDER — PROPOFOL 10 MG/ML IV BOLUS
INTRAVENOUS | Status: DC | PRN
  Administered 2020-06-08 (×2): 10 mg via INTRAVENOUS
  Administered 2020-06-08: 50 mg via INTRAVENOUS
  Administered 2020-06-08: 10 mg via INTRAVENOUS

## 2020-06-08 MED ORDER — GLYCOPYRROLATE 0.2 MG/ML IJ SOLN
INTRAMUSCULAR | Status: DC | PRN
  Administered 2020-06-08: .2 mg via INTRAVENOUS

## 2020-06-08 MED ORDER — PROPOFOL 500 MG/50ML IV EMUL
INTRAVENOUS | Status: DC | PRN
  Administered 2020-06-08: 165 ug/kg/min via INTRAVENOUS

## 2020-06-08 MED ORDER — SODIUM CHLORIDE 0.9 % IV SOLN: INTRAVENOUS | Status: DC

## 2020-06-08 MED ORDER — LIDOCAINE HCL (CARDIAC) PF 100 MG/5ML IV SOSY
PREFILLED_SYRINGE | INTRAVENOUS | Status: DC | PRN
  Administered 2020-06-08: 100 mg via INTRAVENOUS

## 2020-06-08 NOTE — Transfer of Care (Signed)
Immediate Anesthesia Transfer of Care Note  Patient: Lorry Anastasi  Procedure(s) Performed: COLONOSCOPY WITH PROPOFOL (N/A )  Patient Location: Endoscopy Unit  Anesthesia Type:General  Level of Consciousness: drowsy and patient cooperative  Airway & Oxygen Therapy: Patient Spontanous Breathing and Patient connected to face mask oxygen  Post-op Assessment: Report given to RN and Post -op Vital signs reviewed and stable  Post vital signs: Reviewed and stable  Last Vitals:  Vitals Value Taken Time  BP 99/66 06/08/20 1049  Temp 36.6 C 06/08/20 1048  Pulse 73 06/08/20 1049  Resp 16 06/08/20 1049  SpO2 96 % 06/08/20 1049  Vitals shown include unvalidated device data.  Last Pain:  Vitals:   06/08/20 1048  TempSrc: Temporal  PainSc: Asleep         Complications: No complications documented.

## 2020-06-08 NOTE — Anesthesia Procedure Notes (Signed)
Procedure Name: General with mask airway Performed by: Fletcher-Harrison, Kayna Suppa, CRNA Pre-anesthesia Checklist: Patient identified, Emergency Drugs available, Suction available and Patient being monitored Patient Re-evaluated:Patient Re-evaluated prior to induction Oxygen Delivery Method: Simple face mask Induction Type: IV induction Placement Confirmation: positive ETCO2 and CO2 detector Dental Injury: Teeth and Oropharynx as per pre-operative assessment        

## 2020-06-08 NOTE — Anesthesia Postprocedure Evaluation (Signed)
Anesthesia Post Note  Patient: Ethan Jones  Procedure(s) Performed: COLONOSCOPY WITH PROPOFOL (N/A )  Patient location during evaluation: Endoscopy Anesthesia Type: General Level of consciousness: awake and alert and oriented Pain management: pain level controlled Vital Signs Assessment: post-procedure vital signs reviewed and stable Respiratory status: spontaneous breathing, nonlabored ventilation and respiratory function stable Cardiovascular status: blood pressure returned to baseline and stable Postop Assessment: no signs of nausea or vomiting Anesthetic complications: no   No complications documented.   Last Vitals:  Vitals:   06/08/20 1048 06/08/20 1108  BP: 99/66 117/86  Pulse: 72   Resp: 16   Temp: 36.6 C   SpO2: 97%     Last Pain:  Vitals:   06/08/20 1108  TempSrc:   PainSc: 0-No pain                 Erroll Wilbourne

## 2020-06-08 NOTE — Anesthesia Preprocedure Evaluation (Signed)
Anesthesia Evaluation  Patient identified by MRN, date of birth, ID band Patient awake    Reviewed: Allergy & Precautions, NPO status , Patient's Chart, lab work & pertinent test results  History of Anesthesia Complications Negative for: history of anesthetic complications  Airway Mallampati: II  TM Distance: >3 FB Neck ROM: Full    Dental no notable dental hx.    Pulmonary neg pulmonary ROS, neg sleep apnea, neg COPD,    breath sounds clear to auscultation- rhonchi (-) wheezing      Cardiovascular Exercise Tolerance: Good (-) hypertension(-) CAD and (-) Past MI  Rhythm:Regular Rate:Normal - Systolic murmurs and - Diastolic murmurs    Neuro/Psych neg Seizures negative neurological ROS  negative psych ROS   GI/Hepatic negative GI ROS, Neg liver ROS,   Endo/Other  negative endocrine ROSneg diabetes  Renal/GU negative Renal ROS     Musculoskeletal negative musculoskeletal ROS (+)   Abdominal (+) - obese,   Peds  Hematology negative hematology ROS (+)   Anesthesia Other Findings    Reproductive/Obstetrics                             Anesthesia Physical Anesthesia Plan  ASA: I  Anesthesia Plan: General   Post-op Pain Management:    Induction: Intravenous  PONV Risk Score and Plan: 1 and Propofol infusion  Airway Management Planned: Natural Airway  Additional Equipment:   Intra-op Plan:   Post-operative Plan:   Informed Consent: I have reviewed the patients History and Physical, chart, labs and discussed the procedure including the risks, benefits and alternatives for the proposed anesthesia with the patient or authorized representative who has indicated his/her understanding and acceptance.     Dental advisory given  Plan Discussed with: CRNA and Anesthesiologist  Anesthesia Plan Comments:         Anesthesia Quick Evaluation

## 2020-06-08 NOTE — Op Note (Signed)
[facility] Gastroenterology Patient Name: Ethan Jones Procedure Date: 06/08/2020 9:28 AM MRN: [id number] Account #: [id number] Date of Birth: September 28, 1970 Admit Type: Outpatient Age: 49 Room: [facility] ENDO ROOM 1 Gender: Male Note Status: Finalized Procedure:             Colonoscopy Indications:           Screening for colorectal malignant neoplasm, This is                         the patient's first colonoscopy Providers:             Toney Reil MD, MD Referring MD:          Elpidio Galea. Copland MD, MD (Referring MD) Medicines:             General Anesthesia Complications:         No immediate complications. Estimated blood loss: None. Procedure:             Pre-Anesthesia Assessment:                        - Prior to the procedure, a History and Physical was                         performed, and patient medications and allergies were                         reviewed. The patient is competent. The risks and                         benefits of the procedure and the sedation options and                         risks were discussed with the patient. All questions                         were answered and informed consent was obtained.                         Patient identification and proposed procedure were                         verified by the physician, the nurse, the                         anesthesiologist, the anesthetist and the technician                         in the pre-procedure area in the procedure room in the                         endoscopy suite. Mental Status Examination: alert and                         oriented. Airway Examination: normal oropharyngeal                         airway and neck mobility. Respiratory Examination:  clear to auscultation. CV Examination: normal.                         Prophylactic Antibiotics: The patient does not require                         prophylactic antibiotics. Prior  Anticoagulants: The                         patient has taken no previous anticoagulant or                         antiplatelet agents. ASA Grade Assessment: II - A                         patient with mild systemic disease. After reviewing                         the risks and benefits, the patient was deemed in                         satisfactory condition to undergo the procedure. The                         anesthesia plan was to use general anesthesia.                         Immediately prior to administration of medications,                         the patient was re-assessed for adequacy to receive                         sedatives. The heart rate, respiratory rate, oxygen                         saturations, blood pressure, adequacy of pulmonary                         ventilation, and response to care were monitored                         throughout the procedure. The physical status of the                         patient was re-assessed after the procedure.                        After obtaining informed consent, the colonoscope was                         passed under direct vision. Throughout the procedure,                         the patient's blood pressure, pulse, and oxygen                         saturations were monitored continuously. The  Colonoscope was introduced through the anus and                         advanced to the the terminal ileum, with                         identification of the appendiceal orifice and IC                         valve. The colonoscopy was performed without                         difficulty. The patient tolerated the procedure well.                         The quality of the bowel preparation was evaluated                         using the BBPS [facility] Bowel Preparation Scale) with                         scores of: Right Colon = 3, Transverse Colon = 3 and                         Left Colon = 3 (entire mucosa  seen well with no                         residual staining, small fragments of stool or opaque                         liquid). The total BBPS score equals 9. Findings:      The perianal and digital rectal examinations were normal. Pertinent       negatives include normal sphincter tone and no palpable rectal lesions.      The terminal ileum appeared normal.      The entire examined colon appeared normal.      The retroflexed view of the distal rectum and anal verge was normal and       showed no anal or rectal abnormalities. Impression:            - The examined portion of the ileum was normal.                        - The entire examined colon is normal.                        - The distal rectum and anal verge are normal on                         retroflexion view.                        - No specimens collected. Recommendation:        - Discharge patient to home (with escort).                        - Resume previous diet today.                        -  Continue present medications.                        - Repeat colonoscopy in 10 years for screening                         purposes. Procedure Code(s):     --- Professional ---                        J6734, Colorectal cancer screening; colonoscopy on                         individual not meeting criteria for high risk Diagnosis Code(s):     --- Professional ---                        Z12.11, Encounter for screening for malignant neoplasm                         of colon CPT copyright 2019 American Medical Association. All rights reserved. The codes documented in this report are preliminary and upon coder review may  be revised to meet current compliance requirements. Dr. Libby Maw Toney Reil MD, MD 06/08/2020 10:46:06 AM This report has been signed electronically. Number of Addenda: 0 Note Initiated On: 06/08/2020 9:28 AM Scope Withdrawal Time: 0 hours 6 minutes 29 seconds  Total Procedure Duration: 0 hours 9  minutes 5 seconds  Estimated Blood Loss:  Estimated blood loss: none.      [facility]

## 2020-06-08 NOTE — H&P (Signed)
Arlyss Repress, MD [address]  [address]  [place], [place] [address]  Main: [phone]  Fax: [phone] Pager: [phone]  Primary Care Physician:  Hannah Beat, MD Primary Gastroenterologist:  Dr. Arlyss Repress  Pre-Procedure History & Physical: HPI:  Ethan Jones is a 49 y.o. male is here for an colonoscopy.   History reviewed. No pertinent past medical history.  History reviewed. No pertinent surgical history.  Prior to Admission medications   Medication Sig Start Date End Date Taking? Authorizing Provider  PARoxetine (PAXIL) 20 MG tablet TAKE 1 TABLET(20 MG) BY MOUTH DAILY 05/19/20  Yes Copland, Karleen Hampshire, MD  clindamycin (CLEOCIN T) 1 % external solution APP TO NECK HS UTD 12/30/18   [provider]    Allergies as of 05/10/2020   (No Known Allergies)    History reviewed. No pertinent family history.  Social History   Socioeconomic History   Marital status: Married    Spouse name: Not on file   Number of children: Not on file   Years of education: Not on file   Highest education level: Not on file  Occupational History   Not on file  Tobacco Use   Smoking status: Never Smoker   Smokeless tobacco: Never Used  Vaping Use   Vaping Use: Never used  Substance and Sexual Activity   Alcohol use: No   Drug use: No   Sexual activity: Not on file  Other Topics Concern   Not on file  Social History Narrative   Not on file   Social Determinants of Health   Financial Resource Strain:    Difficulty of Paying Living Expenses: Not on file  Food Insecurity:    Worried About Running Out of Food in the Last Year: Not on file   Ran Out of Food in the Last Year: Not on file  Transportation Needs:    Lack of Transportation (Medical): Not on file   Lack of Transportation (Non-Medical): Not on file  Physical Activity:    Days of Exercise per Week: Not on file   Minutes of Exercise per Session: Not on file  Stress:     Feeling of Stress : Not on file  Social Connections:    Frequency of Communication with Friends and Family: Not on file   Frequency of Social Gatherings with Friends and Family: Not on file   Attends Religious Services: Not on file   Active Member of Clubs or Organizations: Not on file   Attends Banker Meetings: Not on file   Marital Status: Not on file  Intimate Partner Violence:    Fear of Current or Ex-Partner: Not on file   Emotionally Abused: Not on file   Physically Abused: Not on file   Sexually Abused: Not on file    Review of Systems: See HPI, otherwise negative ROS  Physical Exam: BP 125/88    Pulse 79    Temp (!) 97.5 F (36.4 C) (Temporal)    Resp 18    Ht 6\' 2"  (1.88 m)    Wt 95.3 kg    SpO2 98%    BMI 26.96 kg/m  General:   Alert,  pleasant and cooperative in NAD Head:  Normocephalic and atraumatic. Neck:  Supple; no masses or thyromegaly. Lungs:  Clear throughout to auscultation.    Heart:  Regular rate and rhythm. Abdomen:  Soft, nontender and nondistended. Normal bowel sounds, without guarding, and without rebound.   Neurologic:  Alert and  oriented x4;  grossly normal neurologically.  Impression/Plan: Spurgeon Gancarz is here for an colonoscopy to be performed for colon cancer screening  Risks, benefits, limitations, and alternatives regarding  colonoscopy have been reviewed with the patient.  Questions have been answered.  All parties agreeable.   Lannette Donath, MD  06/08/2020, 10:24 AM

## 2020-06-09 ENCOUNTER — Encounter: Payer: Self-pay | Admitting: Gastroenterology

## 2020-06-29 DIAGNOSIS — Z85828 Personal history of other malignant neoplasm of skin: Secondary | ICD-10-CM | POA: Diagnosis not present

## 2020-06-29 DIAGNOSIS — C44311 Basal cell carcinoma of skin of nose: Secondary | ICD-10-CM | POA: Diagnosis not present

## 2020-06-29 DIAGNOSIS — L578 Other skin changes due to chronic exposure to nonionizing radiation: Secondary | ICD-10-CM | POA: Diagnosis not present

## 2020-07-13 NOTE — Telephone Encounter (Signed)
open encounter in error 

## 2020-08-29 DIAGNOSIS — R079 Chest pain, unspecified: Secondary | ICD-10-CM | POA: Diagnosis not present

## 2020-08-29 DIAGNOSIS — Z23 Encounter for immunization: Secondary | ICD-10-CM | POA: Diagnosis not present

## 2020-08-29 DIAGNOSIS — R0602 Shortness of breath: Secondary | ICD-10-CM | POA: Diagnosis not present

## 2020-11-18 ENCOUNTER — Other Ambulatory Visit: Payer: Self-pay | Admitting: Family Medicine

## 2020-11-18 NOTE — Telephone Encounter (Signed)
Pt wanting to ck on status of refill request; advised pt paroxetine 20 mg was refilled # 90 x 1 to [facility] electronically on 11/18/20. Pt said he will ck with [facility]; nothing further needed at this time.

## 2020-12-14 DIAGNOSIS — Z859 Personal history of malignant neoplasm, unspecified: Secondary | ICD-10-CM | POA: Diagnosis not present

## 2020-12-14 DIAGNOSIS — Z85828 Personal history of other malignant neoplasm of skin: Secondary | ICD-10-CM | POA: Diagnosis not present

## 2020-12-14 DIAGNOSIS — L578 Other skin changes due to chronic exposure to nonionizing radiation: Secondary | ICD-10-CM | POA: Diagnosis not present

## 2020-12-14 DIAGNOSIS — Z872 Personal history of diseases of the skin and subcutaneous tissue: Secondary | ICD-10-CM | POA: Diagnosis not present

## 2021-03-06 ENCOUNTER — Other Ambulatory Visit: Payer: Self-pay | Admitting: Family Medicine

## 2021-03-06 NOTE — Telephone Encounter (Signed)
Called pt to schedule appt. Pt did not answer so I left a voicemail. 

## 2021-03-06 NOTE — Telephone Encounter (Signed)
Please schedule CPE with fasting labs prior for Dr. Patsy Lager for sometime after 04/25/2021.

## 2021-05-17 ENCOUNTER — Other Ambulatory Visit: Payer: Self-pay

## 2021-05-17 ENCOUNTER — Ambulatory Visit (INDEPENDENT_AMBULATORY_CARE_PROVIDER_SITE_OTHER): Payer: BC Managed Care – PPO | Admitting: Family Medicine

## 2021-05-17 ENCOUNTER — Ambulatory Visit (INDEPENDENT_AMBULATORY_CARE_PROVIDER_SITE_OTHER)
Admission: RE | Admit: 2021-05-17 | Discharge: 2021-05-17 | Disposition: A | Discharge status: Home / Self Care | Payer: BC Managed Care – PPO | Source: Ambulatory Visit | Attending: Family Medicine | Admitting: Family Medicine

## 2021-05-17 ENCOUNTER — Encounter: Payer: Self-pay | Admitting: Family Medicine

## 2021-05-17 VITALS — BP 104/82 | HR 79 | Temp 98.2°F | Ht 74.0 in | Wt 218.5 lb

## 2021-05-17 DIAGNOSIS — R0609 Other forms of dyspnea: Secondary | ICD-10-CM | POA: Diagnosis not present

## 2021-05-17 DIAGNOSIS — Z131 Encounter for screening for diabetes mellitus: Secondary | ICD-10-CM

## 2021-05-17 DIAGNOSIS — Z13220 Encounter for screening for lipoid disorders: Secondary | ICD-10-CM | POA: Diagnosis not present

## 2021-05-17 DIAGNOSIS — R5383 Other fatigue: Secondary | ICD-10-CM | POA: Diagnosis not present

## 2021-05-17 DIAGNOSIS — Z125 Encounter for screening for malignant neoplasm of prostate: Secondary | ICD-10-CM

## 2021-05-17 DIAGNOSIS — Z0000 Encounter for general adult medical examination without abnormal findings: Secondary | ICD-10-CM | POA: Diagnosis not present

## 2021-05-17 DIAGNOSIS — R0600 Dyspnea, unspecified: Secondary | ICD-10-CM | POA: Diagnosis not present

## 2021-05-17 LAB — T3, FREE: T3, Free: 3.6 pg/mL (ref 2.3–4.2)

## 2021-05-17 LAB — BASIC METABOLIC PANEL
BUN: 13 mg/dL (ref 6–23)
CO2: 28 mEq/L (ref 19–32)
Calcium: 9 mg/dL (ref 8.4–10.5)
Chloride: 105 mEq/L (ref 96–112)
Creatinine, Ser: 1.27 mg/dL (ref 0.40–1.50)
GFR: 66.51 mL/min (ref >60.00)
Glucose, Bld: 94 mg/dL (ref 70–99)
Potassium: 4.1 mEq/L (ref 3.5–5.1)
Sodium: 139 mEq/L (ref 135–145)

## 2021-05-17 LAB — LDL CHOLESTEROL, DIRECT: Direct LDL: 135 mg/dL

## 2021-05-17 LAB — BRAIN NATRIURETIC PEPTIDE: Pro B Natriuretic peptide (BNP): 5 pg/mL (ref 0.0–100.0)

## 2021-05-17 LAB — CBC WITH DIFFERENTIAL/PLATELET
Basophils Absolute: 0.1 10*3/uL (ref 0.0–0.1)
Basophils Relative: 1.2 % (ref 0.0–3.0)
Eosinophils Absolute: 0.2 10*3/uL (ref 0.0–0.7)
Eosinophils Relative: 3.6 % (ref 0.0–5.0)
HCT: 47.7 % (ref 39.0–52.0)
Hemoglobin: 16 g/dL (ref 13.0–17.0)
Lymphocytes Relative: 27.5 % (ref 12.0–46.0)
Lymphs Abs: 1.8 10*3/uL (ref 0.7–4.0)
MCHC: 33.5 g/dL (ref 30.0–36.0)
MCV: 86.4 fl (ref 78.0–100.0)
Monocytes Absolute: 0.4 10*3/uL (ref 0.1–1.0)
Monocytes Relative: 6.1 % (ref 3.0–12.0)
Neutro Abs: 4.1 10*3/uL (ref 1.4–7.7)
Neutrophils Relative %: 61.6 % (ref 43.0–77.0)
Platelets: 289 10*3/uL (ref 150.0–400.0)
RBC: 5.53 Mil/uL (ref 4.22–5.81)
RDW: 13.6 % (ref 11.5–15.5)
WBC: 6.6 10*3/uL (ref 4.0–10.5)

## 2021-05-17 LAB — LIPID PANEL
Cholesterol: 199 mg/dL (ref 0–200)
HDL: 35.3 mg/dL — ABNORMAL LOW (ref >39.00)
NonHDL: 163.26
Total CHOL/HDL Ratio: 6
Triglycerides: 221 mg/dL — ABNORMAL HIGH (ref 0.0–149.0)
VLDL: 44.2 mg/dL — ABNORMAL HIGH (ref 0.0–40.0)

## 2021-05-17 LAB — VITAMIN B12: Vitamin B-12: 200 pg/mL — ABNORMAL LOW (ref 211–911)

## 2021-05-17 LAB — HEPATIC FUNCTION PANEL
ALT: 24 U/L (ref 0–53)
AST: 20 U/L (ref 0–37)
Albumin: 4.5 g/dL (ref 3.5–5.2)
Alkaline Phosphatase: 77 U/L (ref 39–117)
Bilirubin, Direct: 0.1 mg/dL (ref 0.0–0.3)
Total Bilirubin: 0.5 mg/dL (ref 0.2–1.2)
Total Protein: 6.9 g/dL (ref 6.0–8.3)

## 2021-05-17 LAB — T4, FREE: Free T4: 0.74 ng/dL (ref 0.60–1.60)

## 2021-05-17 LAB — HEMOGLOBIN A1C: Hgb A1c MFr Bld: 5.4 % (ref 4.6–6.5)

## 2021-05-17 LAB — TSH: TSH: 1.68 u[IU]/mL (ref 0.35–5.50)

## 2021-05-17 NOTE — Progress Notes (Signed)
Jakirah Zaun T. Jaxson Keener, MD, CAQ Sports Medicine [facility] [address] [place], [address]  Phone: [phone]  FAX: [phone]  Ethan Jones - 50 y.o. male  MRN [id number]  Date of Birth: 02-07-1971  Date: 05/17/2021  PCP: Hannah Beat, MD  Referral: Hannah Beat, MD  Chief Complaint  Patient presents with   Annual Exam    This visit occurred during the SARS-CoV-2 public health emergency.  Safety protocols were in place, including screening questions prior to the visit, additional usage of staff PPE, and extensive cleaning of exam room while observing appropriate contact time as indicated for disinfecting solutions.   Patient Care Team: Hannah Beat, MD as PCP - General Subjective:   Ethan Jones is a 50 y.o. pleasant patient who presents with the following:  Preventative Health Maintenance Visit:  Health Maintenance Summary Reviewed and updated, unless pt declines services.  Tobacco History Reviewed. Alcohol: No concerns, no excessive use Exercise Habits: Some activity, rec at least 30 mins 5 times a week STD concerns: no risk or activity to increase risk Drug Use: None  Hep C Bivalent covid Flu - done  Fatiue and SOB Plays basketball.  6 months or so ago.  Strenous and panic.   Pushing bike even walking Some sob.  When stops, will get better - 5 mins.  Dad had CABG PGF, CAD MGM: had a stroke  Did have a stress test that was normal in 2021. Stress echo normal Dr. Buel Ream  Never tested positive for covid.   No smoking.  More fatigue  Lab Results  Component Value Date   HGBA1C 5.4 05/17/2021    Lab Results  Component Value Date   CHOL 199 05/17/2021   CHOL 186 04/13/2020   CHOL 173 04/18/2017   Lab Results  Component Value Date   HDL 35.30 (L) 05/17/2021   HDL 34.50 (L) 04/13/2020   HDL 46.30 04/18/2017   Lab Results  Component Value Date   LDLCALC 90 04/18/2017   LDLCALC 151 (H)  12/12/2015   LDLCALC 136 (H) 04/19/2008   Lab Results  Component Value Date   TRIG 221.0 (H) 05/17/2021   TRIG 292.0 (H) 04/13/2020   TRIG 180.0 (H) 04/18/2017   Lab Results  Component Value Date   CHOLHDL 6 05/17/2021   CHOLHDL 5 04/13/2020   CHOLHDL 4 04/18/2017   Lab Results  Component Value Date   LDLDIRECT 135.0 05/17/2021   LDLDIRECT 121.0 04/13/2020   LDLDIRECT 112.4 05/07/2013     Constant level of activity, now all of the sudden.   When sitting on hard surgaces, tailbone will hurt some.   Some tailbone pain. - no injury or pain.   Health Maintenance  Topic Date Due   Pneumococcal Vaccine 66-3 Years old (1 - PCV) Never done   Hepatitis C Screening  Never done   COVID-19 Vaccine (4 - Booster for Pfizer series) 09/27/2020   TETANUS/TDAP  05/14/2023   COLONOSCOPY (Pts 45-23yrs Insurance coverage will need to be confirmed)  06/08/2030   INFLUENZA VACCINE  Completed   HIV Screening  Completed   HPV VACCINES  Aged Out   Immunization History  Administered Date(s) Administered   Influenza,inj,Quad PF,6+ Mos 04/25/2017, 04/28/2018, 03/30/2019, 04/25/2020, 04/24/2021   Influenza,inj,quad, With Preservative 07/11/2015   PFIZER(Purple Top)SARS-COV-2 Vaccination 11/09/2019, 12/01/2019, 08/02/2020   Tdap 05/13/2013   Patient Active Problem List   Diagnosis Date Noted   Encounter for screening colonoscopy    Encounter for  colonoscopy in patient with family history of colon polyps     History reviewed. No pertinent past medical history.  Past Surgical History:  Procedure Laterality Date   COLONOSCOPY WITH PROPOFOL N/A 06/08/2020   Procedure: COLONOSCOPY WITH PROPOFOL;  Surgeon: Toney Reil, MD;  Location: [facility] ENDOSCOPY;  Service: Gastroenterology;  Laterality: N/A;    History reviewed. No pertinent family history.  Past Medical History, Surgical History, Social History, Family History, Problem List, Medications, and Allergies have been reviewed and  updated if relevant.  Review of Systems: Pertinent positives are listed above.  Otherwise, a full 14 point review of systems has been done in full and it is negative except where it is noted positive.  Objective:   BP 104/82   Pulse 79   Temp 98.2 F (36.8 C) (Temporal)   Ht 6\' 2"  (1.88 m)   Wt 218 lb 8 oz (99.1 kg)   SpO2 96%   BMI 28.05 kg/m  Ideal Body Weight: Weight in (lb) to have BMI = 25: 194.3  Ideal Body Weight: Weight in (lb) to have BMI = 25: 194.3 No results found. Depression screen [facility] 2/9 05/17/2021 04/25/2020 04/25/2017  Decreased Interest 0 0 0  Down, Depressed, Hopeless 0 0 0  PHQ - 2 Score 0 0 0     GEN: well developed, well nourished, no acute distress Eyes: conjunctiva and lids normal, PERRLA, EOMI ENT: TM clear, nares clear, oral exam WNL Neck: supple, no lymphadenopathy, no thyromegaly, no JVD Pulm: clear to auscultation and percussion, respiratory effort normal CV: regular rate and rhythm, S1-S2, no murmur, rub or gallop, no bruits, peripheral pulses normal and symmetric, no cyanosis, clubbing, edema or varicosities GI: soft, non-tender; no hepatosplenomegaly, masses; active bowel sounds all quadrants GU: deferred Lymph: no cervical, axillary or inguinal adenopathy MSK: gait normal, muscle tone and strength WNL, no joint swelling, effusions, discoloration, crepitus  SKIN: clear, good turgor, color WNL, no rashes, lesions, or ulcerations Neuro: normal mental status, normal strength, sensation, and motion Psych: alert; oriented to person, place and time, normally interactive and not anxious or depressed in appearance.  All labs reviewed with patient. Results for orders placed or performed in visit on 05/17/21  Basic metabolic panel  Result Value Ref Range   Sodium 139 135 - 145 mEq/L   Potassium 4.1 3.5 - 5.1 mEq/L   Chloride 105 96 - 112 mEq/L   CO2 28 19 - 32 mEq/L   Glucose, Bld 94 70 - 99 mg/dL   BUN 13 6 - 23 mg/dL   Creatinine, Ser 05/19/21 0.40  - 1.50 mg/dL   GFR 5.80 99.83 mL/min   Calcium 9.0 8.4 - 10.5 mg/dL  CBC with Differential/Platelet  Result Value Ref Range   WBC 6.6 4.0 - 10.5 K/uL   RBC 5.53 4.22 - 5.81 Mil/uL   Hemoglobin 16.0 13.0 - 17.0 g/dL   HCT >38.25 05.3 - 97.6 %   MCV 86.4 78.0 - 100.0 fl   MCHC 33.5 30.0 - 36.0 g/dL   RDW 73.4 19.3 - 79.0 %   Platelets 289.0 150.0 - 400.0 K/uL   Neutrophils Relative % 61.6 43.0 - 77.0 %   Lymphocytes Relative 27.5 12.0 - 46.0 %   Monocytes Relative 6.1 3.0 - 12.0 %   Eosinophils Relative 3.6 0.0 - 5.0 %   Basophils Relative 1.2 0.0 - 3.0 %   Neutro Abs 4.1 1.4 - 7.7 K/uL   Lymphs Abs 1.8 0.7 - 4.0 K/uL   Monocytes  Absolute 0.4 0.1 - 1.0 K/uL   Eosinophils Absolute 0.2 0.0 - 0.7 K/uL   Basophils Absolute 0.1 0.0 - 0.1 K/uL  Hepatic function panel  Result Value Ref Range   Total Bilirubin 0.5 0.2 - 1.2 mg/dL   Bilirubin, Direct 0.1 0.0 - 0.3 mg/dL   Alkaline Phosphatase 77 39 - 117 U/L   AST 20 0 - 37 U/L   ALT 24 0 - 53 U/L   Total Protein 6.9 6.0 - 8.3 g/dL   Albumin 4.5 3.5 - 5.2 g/dL  Lipid panel  Result Value Ref Range   Cholesterol 199 0 - 200 mg/dL   Triglycerides 846.9 (H) 0.0 - 149.0 mg/dL   HDL 62.95 (L) >28.41 mg/dL   VLDL 32.4 (H) 0.0 - 40.1 mg/dL   Total CHOL/HDL Ratio 6    NonHDL 163.26   PSA, Total with Reflex to PSA, Free  Result Value Ref Range   PSA, Total 1.0 < OR = 4.0 ng/mL  Hemoglobin A1c  Result Value Ref Range   Hgb A1c MFr Bld 5.4 4.6 - 6.5 %  T3, free  Result Value Ref Range   T3, Free 3.6 2.3 - 4.2 pg/mL  T4, free  Result Value Ref Range   Free T4 0.74 0.60 - 1.60 ng/dL  TSH  Result Value Ref Range   TSH 1.68 0.35 - 5.50 uIU/mL  Vitamin B12  Result Value Ref Range   Vitamin B-12 200 (L) 211 - 911 pg/mL  Brain natriuretic peptide  Result Value Ref Range   Pro B Natriuretic peptide (BNP) 5.0 0.0 - 100.0 pg/mL  LDL cholesterol, direct  Result Value Ref Range   Direct LDL 135.0 mg/dL   DG Chest 2 View  Result Date:  05/17/2021 CLINICAL DATA:  Dyspnea. EXAM: CHEST - 2 VIEW COMPARISON:  March 26, 2019. FINDINGS: The heart size and mediastinal contours are within normal limits. Both lungs are clear. The visualized skeletal structures are unremarkable. IMPRESSION: No active cardiopulmonary disease. Electronically Signed   By: Lupita Raider M.D.   On: 05/17/2021 16:49     Assessment and Plan:     ICD-10-CM   1. Healthcare maintenance  Z00.00     2. Screening for diabetes mellitus  Z13.1 Basic metabolic panel    Hemoglobin A1c    3. Screening, lipid  Z13.220 Lipid panel    4. Screening for malignant neoplasm of prostate  Z12.5 PSA, Total with Reflex to PSA, Free    5. Other fatigue  R53.83 CBC with Differential/Platelet    Hepatic function panel    T3, free    T4, free    TSH    Vitamin B12    Brain natriuretic peptide    6. Dyspnea on exertion  R06.09 DG Chest 2 View    Brain natriuretic peptide     In terms of health maintenance, I did recommend BiValent COVID-vaccine. He could stand to lose some weight and get in better shape.  Total encounter time: 20 minutes. This includes total time spent on the day of encounter.  Additional time in discussion and evaluation for 22-month history of dyspnea on exertion.  He did have a prior low risk cardiology work-up 2 years ago.  This included a negative stress test.  He has not a smoker and his chest x-ray is normal.  Data including the care everywhere data from [facility] clinic is evaluated by myself.  Additional records are reviewed.  I am going to check laboratories  as above including a cardiac BNP and chest x-ray, and this is returned normal.  Ultimately, if this all returns normal, then I think additional cardiology evaluation for progressive dyspnea on exertion would be reasonable and appropriate.  Health Maintenance Exam: The patient's preventative maintenance and recommended screening tests for an annual wellness exam were reviewed in full  today. Brought up to date unless services declined.  Counselled on the importance of diet, exercise, and its role in overall health and mortality. The patient's FH and SH was reviewed, including their home life, tobacco status, and drug and alcohol status.  Follow-up in 1 year for physical exam or additional follow-up below.  Follow-up: No follow-ups on file. Or follow-up in 1 year if not noted.  No orders of the defined types were placed in this encounter.  There are no discontinued medications. Orders Placed This Encounter  Procedures   DG Chest 2 View   Basic metabolic panel   CBC with Differential/Platelet   Hepatic function panel   Lipid panel   PSA, Total with Reflex to PSA, Free   Hemoglobin A1c   T3, free   T4, free   TSH   Vitamin B12   Brain natriuretic peptide   LDL cholesterol, direct    Signed,  Hewitt Garner T. Deni Lefever, MD   Allergies as of 05/17/2021   No Known Allergies      Medication List        Accurate as of May 17, 2021 11:59 PM. If you have any questions, ask your nurse or doctor.          clindamycin 1 % external solution Commonly known as: CLEOCIN T APP TO NECK HS UTD   PARoxetine 20 MG tablet Commonly known as: PAXIL TAKE 1 TABLET(20 MG) BY MOUTH DAILY

## 2021-05-18 LAB — PSA, TOTAL WITH REFLEX TO PSA, FREE: PSA, Total: 1 ng/mL (ref <=4.0)

## 2021-06-14 ENCOUNTER — Other Ambulatory Visit: Payer: Self-pay | Admitting: Family Medicine

## 2021-07-03 DIAGNOSIS — L738 Other specified follicular disorders: Secondary | ICD-10-CM | POA: Diagnosis not present

## 2021-07-03 DIAGNOSIS — Z859 Personal history of malignant neoplasm, unspecified: Secondary | ICD-10-CM | POA: Diagnosis not present

## 2021-07-03 DIAGNOSIS — L578 Other skin changes due to chronic exposure to nonionizing radiation: Secondary | ICD-10-CM | POA: Diagnosis not present

## 2021-07-03 DIAGNOSIS — Z85828 Personal history of other malignant neoplasm of skin: Secondary | ICD-10-CM | POA: Diagnosis not present

## 2021-10-13 ENCOUNTER — Telehealth: Payer: Self-pay

## 2021-10-13 NOTE — Telephone Encounter (Signed)
Patient is calling about a topical cream for head bumps called. [facility] in [place]. Please advise

## 2021-10-16 NOTE — Telephone Encounter (Signed)
Left message on voicemail for patient to call the office back. 

## 2021-10-16 NOTE — Telephone Encounter (Signed)
Pt returned call would like a call back #[phone]  ?

## 2021-10-17 NOTE — Telephone Encounter (Signed)
Left message on voicemail for patient to call the office back. 

## 2021-10-18 MED ORDER — CLINDAMYCIN PHOSPHATE 1 % EX SOLN: CUTANEOUS | 3 refills | Status: DC

## 2021-10-18 NOTE — Addendum Note (Signed)
Addended by: Sydell Axon C on: 10/18/2021 10:24 AM ? ? Modules accepted: Orders ? ?

## 2021-10-18 NOTE — Telephone Encounter (Signed)
Spoke to patient by telephone and was advised that he gets red bumps on his head and Dr. Patsy Lager prescribed a a topical solutions Cleocin. Patient stated that he has run out and needs a new script sent to the pharmacy. Patient stated that this has not been filled for a while because he does not use it often. ?[facility]/[place] ?Last office visit 05/17/21 ? ?

## 2021-10-18 NOTE — Addendum Note (Signed)
Addended by: Hannah Beat on: 10/18/2021 01:09 PM ? ? Modules accepted: Orders ? ?

## 2021-12-15 ENCOUNTER — Other Ambulatory Visit: Payer: Self-pay | Admitting: Family Medicine

## 2022-05-28 DIAGNOSIS — R921 Mammographic calcification found on diagnostic imaging of breast: Secondary | ICD-10-CM | POA: Diagnosis not present

## 2022-08-23 ENCOUNTER — Other Ambulatory Visit: Payer: Self-pay | Admitting: Family Medicine

## 2022-08-23 NOTE — Telephone Encounter (Signed)
Last office visit 05/17/21 for CPE.  Last refilled 12/15/21 for #90 with 1 refill.   Scheduled called him today and left message to call back and schedule CPE.  No future appointments.  Refill?

## 2022-08-23 NOTE — Telephone Encounter (Signed)
Last office visit 05/17/21 for CPE.  Last refilled 10/19/21 for 30 ml with 3 refills.  Scheduled called him today and left message to call back and schedule CPE.  No future appointments.  Refill?

## 2022-08-23 NOTE — Telephone Encounter (Signed)
LVM for patient to call back and schedule

## 2022-08-23 NOTE — Telephone Encounter (Signed)
Please call and schedule CPE with fasting labs prior with Dr. Lorelei Pont.  Needs office visit to continue getting refills on his medication.  Please send back to me once scheduled.

## 2022-09-17 ENCOUNTER — Other Ambulatory Visit: Payer: Self-pay | Admitting: Family Medicine

## 2022-10-23 ENCOUNTER — Telehealth: Payer: Self-pay | Admitting: Family Medicine

## 2022-10-23 MED ORDER — PAROXETINE HCL 20 MG PO TABS: 20.0000 mg | ORAL_TABLET | Freq: Every day | ORAL | 0 refills | Status: DC

## 2022-10-23 NOTE — Telephone Encounter (Signed)
30 day refill provider until patient is seen for his CPE on 11/14/2022.  Patient has not been seen since 2022.

## 2022-10-23 NOTE — Telephone Encounter (Signed)
Prescription Request  10/23/2022  LOV: Visit date not found  What is the name of the medication or equipment?  PARoxetine (PAXIL) 20 MG tablet  Have you contacted your pharmacy to request a refill? Yes   Which pharmacy would you like this sent to?   [facility], [address] [place] [address] Phone: [phone] Fax: [phone]    Patient notified that their request is being sent to the clinical staff for review and that they should receive a response within 2 business days.   Please advise at Mobile [phone] (mobile)

## 2022-10-24 MED ORDER — PAROXETINE HCL 20 MG PO TABS: 20.0000 mg | ORAL_TABLET | Freq: Every day | ORAL | 0 refills | Status: DC

## 2022-10-24 NOTE — Telephone Encounter (Addendum)
Refill resent to [facility] in [place] as requested.  Patient notified of this via telephone.

## 2022-10-24 NOTE — Addendum Note (Signed)
Addended by: Carter Kitten on: 10/24/2022 10:54 AM   Modules accepted: Orders

## 2022-10-24 NOTE — Telephone Encounter (Signed)
Patient called in and stated that he needs the medication to go to [facility], [place]. He stated that [place] will not have the medication to him until Friday but he needs it now. Please advise. Thank you!

## 2022-10-30 ENCOUNTER — Other Ambulatory Visit: Payer: Self-pay | Admitting: Family Medicine

## 2022-10-30 DIAGNOSIS — R5383 Other fatigue: Secondary | ICD-10-CM

## 2022-10-30 DIAGNOSIS — Z13220 Encounter for screening for lipoid disorders: Secondary | ICD-10-CM

## 2022-10-30 DIAGNOSIS — Z125 Encounter for screening for malignant neoplasm of prostate: Secondary | ICD-10-CM

## 2022-10-30 DIAGNOSIS — Z131 Encounter for screening for diabetes mellitus: Secondary | ICD-10-CM

## 2022-11-07 ENCOUNTER — Other Ambulatory Visit (INDEPENDENT_AMBULATORY_CARE_PROVIDER_SITE_OTHER): Payer: BC Managed Care – PPO

## 2022-11-07 DIAGNOSIS — Z131 Encounter for screening for diabetes mellitus: Secondary | ICD-10-CM

## 2022-11-07 DIAGNOSIS — Z125 Encounter for screening for malignant neoplasm of prostate: Secondary | ICD-10-CM

## 2022-11-07 DIAGNOSIS — R5383 Other fatigue: Secondary | ICD-10-CM | POA: Diagnosis not present

## 2022-11-07 DIAGNOSIS — Z13220 Encounter for screening for lipoid disorders: Secondary | ICD-10-CM

## 2022-11-07 DIAGNOSIS — R921 Mammographic calcification found on diagnostic imaging of breast: Secondary | ICD-10-CM | POA: Diagnosis not present

## 2022-11-07 LAB — LIPID PANEL
Cholesterol: 232 mg/dL — ABNORMAL HIGH (ref 0–200)
HDL: 34.9 mg/dL — ABNORMAL LOW (ref >39.00)
NonHDL: 197.13
Total CHOL/HDL Ratio: 7
Triglycerides: 307 mg/dL — ABNORMAL HIGH (ref 0.0–149.0)
VLDL: 61.4 mg/dL — ABNORMAL HIGH (ref 0.0–40.0)

## 2022-11-07 LAB — HEPATIC FUNCTION PANEL
ALT: 49 U/L (ref 0–53)
AST: 32 U/L (ref 0–37)
Albumin: 4.3 g/dL (ref 3.5–5.2)
Alkaline Phosphatase: 81 U/L (ref 39–117)
Bilirubin, Direct: 0.1 mg/dL (ref 0.0–0.3)
Total Bilirubin: 0.5 mg/dL (ref 0.2–1.2)
Total Protein: 6.6 g/dL (ref 6.0–8.3)

## 2022-11-07 LAB — CBC WITH DIFFERENTIAL/PLATELET
Basophils Absolute: 0.1 10*3/uL (ref 0.0–0.1)
Basophils Relative: 0.9 % (ref 0.0–3.0)
Eosinophils Absolute: 0.3 10*3/uL (ref 0.0–0.7)
Eosinophils Relative: 4 % (ref 0.0–5.0)
HCT: 47.8 % (ref 39.0–52.0)
Hemoglobin: 15.9 g/dL (ref 13.0–17.0)
Lymphocytes Relative: 29.8 % (ref 12.0–46.0)
Lymphs Abs: 2.1 10*3/uL (ref 0.7–4.0)
MCHC: 33.3 g/dL (ref 30.0–36.0)
MCV: 86.2 fl (ref 78.0–100.0)
Monocytes Absolute: 0.5 10*3/uL (ref 0.1–1.0)
Monocytes Relative: 7.1 % (ref 3.0–12.0)
Neutro Abs: 4.1 10*3/uL (ref 1.4–7.7)
Neutrophils Relative %: 58.2 % (ref 43.0–77.0)
Platelets: 264 10*3/uL (ref 150.0–400.0)
RBC: 5.55 Mil/uL (ref 4.22–5.81)
RDW: 13.7 % (ref 11.5–15.5)
WBC: 7 10*3/uL (ref 4.0–10.5)

## 2022-11-07 LAB — HEMOGLOBIN A1C: Hgb A1c MFr Bld: 5.6 % (ref 4.6–6.5)

## 2022-11-07 LAB — BASIC METABOLIC PANEL
BUN: 12 mg/dL (ref 6–23)
CO2: 29 mEq/L (ref 19–32)
Calcium: 9.2 mg/dL (ref 8.4–10.5)
Chloride: 104 mEq/L (ref 96–112)
Creatinine, Ser: 1.25 mg/dL (ref 0.40–1.50)
GFR: 67.09 mL/min (ref >60.00)
Glucose, Bld: 89 mg/dL (ref 70–99)
Potassium: 4.1 mEq/L (ref 3.5–5.1)
Sodium: 141 mEq/L (ref 135–145)

## 2022-11-07 LAB — LDL CHOLESTEROL, DIRECT: Direct LDL: 139 mg/dL

## 2022-11-09 LAB — PSA, TOTAL WITH REFLEX TO PSA, FREE: PSA, Total: 0.9 ng/mL (ref <=4.0)

## 2022-11-13 NOTE — Progress Notes (Unsigned)
Ethan Langhorst T. Khyrin Trevathan, MD, CAQ Sports Medicine [facility] [address] [place], [address]  Phone: [phone]  FAX: [phone]  Kirubel Jones - 52 y.o. male  MRN [id number]  Date of Birth: 11-20-1970  Date: 11/14/2022  PCP: Hannah Beat, MD  Referral: Hannah Beat, MD  No chief complaint on file.  Patient Care Team: Hannah Beat, MD as PCP - General Subjective:   Ethan Jones is a 52 y.o. pleasant patient who presents with the following:  Preventative Health Maintenance Visit:  Health Maintenance Summary Reviewed and updated, unless pt declines services.  Tobacco History Reviewed. Alcohol: No concerns, no excessive use Exercise Habits: Some activity, rec at least 30 mins 5 times a week STD concerns: no risk or activity to increase risk Drug Use: None  Shingrix Covid booster  Health Maintenance  Topic Date Due   Hepatitis C Screening  Never done   Zoster Vaccines- Shingrix (1 of 2) Never done   COVID-19 Vaccine (4 - 2023-24 season) 03/30/2022   INFLUENZA VACCINE  02/28/2023   DTaP/Tdap/Td (2 - Td or Tdap) 05/14/2023   COLONOSCOPY (Pts 45-1yrs Insurance coverage will need to be confirmed)  06/08/2030   HIV Screening  Completed   HPV VACCINES  Aged Out   Immunization History  Administered Date(s) Administered   Influenza,inj,Quad PF,6+ Mos 04/25/2017, 04/28/2018, 03/30/2019, 04/25/2020, 04/24/2021   Influenza,inj,quad, With Preservative 07/11/2015   PFIZER(Purple Top)SARS-COV-2 Vaccination 11/09/2019, 12/01/2019, 08/02/2020   Tdap 05/13/2013   Patient Active Problem List   Diagnosis Date Noted   Encounter for screening colonoscopy    Encounter for colonoscopy in patient with family history of colon polyps     No past medical history on file.  Past Surgical History:  Procedure Laterality Date   COLONOSCOPY WITH PROPOFOL N/A 06/08/2020   Procedure: COLONOSCOPY WITH PROPOFOL;  Surgeon: Toney Reil, MD;  Location: [facility] ENDOSCOPY;  Service: Gastroenterology;  Laterality: N/A;    No family history on file.  Social History   Social History Narrative   Not on file    Past Medical History, Surgical History, Social History, Family History, Problem List, Medications, and Allergies have been reviewed and updated if relevant.  Review of Systems: Pertinent positives are listed above.  Otherwise, a full 14 point review of systems has been done in full and it is negative except where it is noted positive.  Objective:   There were no vitals taken for this visit. Ideal Body Weight:    Ideal Body Weight:   No results found.    05/17/2021   10:31 AM 04/25/2020    8:47 AM 04/25/2017    8:25 AM  Depression screen PHQ 2/9  Decreased Interest 0 0 0  Down, Depressed, Hopeless 0 0 0  PHQ - 2 Score 0 0 0     GEN: well developed, well nourished, no acute distress Eyes: conjunctiva and lids normal, PERRLA, EOMI ENT: TM clear, nares clear, oral exam WNL Neck: supple, no lymphadenopathy, no thyromegaly, no JVD Pulm: clear to auscultation and percussion, respiratory effort normal CV: regular rate and rhythm, S1-S2, no murmur, rub or gallop, no bruits, peripheral pulses normal and symmetric, no cyanosis, clubbing, edema or varicosities GI: soft, non-tender; no hepatosplenomegaly, masses; active bowel sounds all quadrants GU: deferred Lymph: no cervical, axillary or inguinal adenopathy MSK: gait normal, muscle tone and strength WNL, no joint swelling, effusions, discoloration, crepitus  SKIN: clear, good turgor, color WNL, no rashes, lesions, or ulcerations Neuro:  normal mental status, normal strength, sensation, and motion Psych: alert; oriented to person, place and time, normally interactive and not anxious or depressed in appearance.  All labs reviewed with patient. Results for orders placed or performed in visit on 11/07/22  PSA, Total with Reflex to PSA, Free  Result Value Ref  Range   PSA, Total 0.9 < OR = 4.0 ng/mL  Lipid panel  Result Value Ref Range   Cholesterol 232 (H) 0 - 200 mg/dL   Triglycerides 161.0 (H) 0.0 - 149.0 mg/dL   HDL 96.04 (L) >54.09 mg/dL   VLDL 81.1 (H) 0.0 - 91.4 mg/dL   Total CHOL/HDL Ratio 7    NonHDL 197.13   Hemoglobin A1c  Result Value Ref Range   Hgb A1c MFr Bld 5.6 4.6 - 6.5 %  Hepatic function panel  Result Value Ref Range   Total Bilirubin 0.5 0.2 - 1.2 mg/dL   Bilirubin, Direct 0.1 0.0 - 0.3 mg/dL   Alkaline Phosphatase 81 39 - 117 U/L   AST 32 0 - 37 U/L   ALT 49 0 - 53 U/L   Total Protein 6.6 6.0 - 8.3 g/dL   Albumin 4.3 3.5 - 5.2 g/dL  CBC with Differential/Platelet  Result Value Ref Range   WBC 7.0 4.0 - 10.5 K/uL   RBC 5.55 4.22 - 5.81 Mil/uL   Hemoglobin 15.9 13.0 - 17.0 g/dL   HCT 78.2 95.6 - 21.3 %   MCV 86.2 78.0 - 100.0 fl   MCHC 33.3 30.0 - 36.0 g/dL   RDW 08.6 57.8 - 46.9 %   Platelets 264.0 150.0 - 400.0 K/uL   Neutrophils Relative % 58.2 43.0 - 77.0 %   Lymphocytes Relative 29.8 12.0 - 46.0 %   Monocytes Relative 7.1 3.0 - 12.0 %   Eosinophils Relative 4.0 0.0 - 5.0 %   Basophils Relative 0.9 0.0 - 3.0 %   Neutro Abs 4.1 1.4 - 7.7 K/uL   Lymphs Abs 2.1 0.7 - 4.0 K/uL   Monocytes Absolute 0.5 0.1 - 1.0 K/uL   Eosinophils Absolute 0.3 0.0 - 0.7 K/uL   Basophils Absolute 0.1 0.0 - 0.1 K/uL  Basic metabolic panel  Result Value Ref Range   Sodium 141 135 - 145 mEq/L   Potassium 4.1 3.5 - 5.1 mEq/L   Chloride 104 96 - 112 mEq/L   CO2 29 19 - 32 mEq/L   Glucose, Bld 89 70 - 99 mg/dL   BUN 12 6 - 23 mg/dL   Creatinine, Ser 6.29 0.40 - 1.50 mg/dL   GFR 52.84 >13.24 mL/min   Calcium 9.2 8.4 - 10.5 mg/dL  LDL cholesterol, direct  Result Value Ref Range   Direct LDL 139.0 mg/dL    Assessment and Plan:     ICD-10-CM   1. Healthcare maintenance  Z00.00       Health Maintenance Exam: The patient's preventative maintenance and recommended screening tests for an annual wellness exam were  reviewed in full today. Brought up to date unless services declined.  Counselled on the importance of diet, exercise, and its role in overall health and mortality. The patient's FH and SH was reviewed, including their home life, tobacco status, and drug and alcohol status.  Follow-up in 1 year for physical exam or additional follow-up below.  Disposition: No follow-ups on file.  No orders of the defined types were placed in this encounter.  There are no discontinued medications. No orders of the defined types were placed in this  encounter.   Signed,  Elpidio Galea. Savannaha Stonerock, MD   Allergies as of 11/14/2022   No Known Allergies      Medication List        Accurate as of November 13, 2022  3:49 PM. If you have any questions, ask your nurse or doctor.          clindamycin 1 % external solution Commonly known as: CLEOCIN T Apply topically to neck at night as needed for outbreak.   PARoxetine 20 MG tablet Commonly known as: PAXIL Take 1 tablet (20 mg total) by mouth daily.

## 2022-11-14 ENCOUNTER — Encounter: Payer: Self-pay | Admitting: *Deleted

## 2022-11-14 ENCOUNTER — Encounter: Payer: Self-pay | Admitting: Family Medicine

## 2022-11-14 ENCOUNTER — Ambulatory Visit (INDEPENDENT_AMBULATORY_CARE_PROVIDER_SITE_OTHER): Payer: BC Managed Care – PPO | Admitting: Family Medicine

## 2022-11-14 VITALS — BP 100/80 | HR 67 | Temp 97.9°F | Ht 74.0 in | Wt 228.4 lb

## 2022-11-14 DIAGNOSIS — Z23 Encounter for immunization: Secondary | ICD-10-CM | POA: Diagnosis not present

## 2022-11-14 DIAGNOSIS — R0609 Other forms of dyspnea: Secondary | ICD-10-CM | POA: Diagnosis not present

## 2022-11-14 DIAGNOSIS — Z0000 Encounter for general adult medical examination without abnormal findings: Secondary | ICD-10-CM | POA: Diagnosis not present

## 2022-11-20 ENCOUNTER — Other Ambulatory Visit: Payer: Self-pay | Admitting: Family Medicine

## 2022-12-04 ENCOUNTER — Ambulatory Visit
Admission: RE | Admit: 2022-12-04 | Discharge: 2022-12-04 | Disposition: A | Discharge status: Home / Self Care | Payer: BC Managed Care – PPO | Source: Ambulatory Visit | Attending: Family Medicine | Admitting: Family Medicine

## 2022-12-04 DIAGNOSIS — R0609 Other forms of dyspnea: Secondary | ICD-10-CM | POA: Insufficient documentation

## 2023-02-19 ENCOUNTER — Ambulatory Visit: Payer: BC Managed Care – PPO

## 2023-02-28 ENCOUNTER — Ambulatory Visit (INDEPENDENT_AMBULATORY_CARE_PROVIDER_SITE_OTHER): Payer: BC Managed Care – PPO

## 2023-02-28 DIAGNOSIS — Z23 Encounter for immunization: Secondary | ICD-10-CM | POA: Diagnosis not present

## 2023-05-16 ENCOUNTER — Other Ambulatory Visit: Payer: Self-pay | Admitting: Family Medicine

## 2023-07-16 DIAGNOSIS — J340 Abscess, furuncle and carbuncle of nose: Secondary | ICD-10-CM | POA: Diagnosis not present

## 2023-11-26 ENCOUNTER — Other Ambulatory Visit: Payer: Self-pay | Admitting: Family Medicine

## 2023-11-26 NOTE — Progress Notes (Unsigned)
 Ethan Moline T. Aamari Strawderman, MD, CAQ Sports Medicine [facility] [address] [place], [address]  Phone: [phone]  FAX: [phone]  Ethan Jones - 53 y.o. male  MRN [id number]  Date of Birth: Jan 09, 1971  Date: 11/27/2023  PCP: Scherrie Curt, MD  Referral: Scherrie Curt, MD  No chief complaint on file.  Patient Care Team: Scherrie Curt, MD as PCP - General Subjective:   Ethan Jones is a 53 y.o. pleasant patient who presents with the following:  Preventative Health Maintenance Visit:  Health Maintenance Summary Reviewed and updated, unless pt declines services.  Tobacco History Reviewed. Alcohol: No concerns, no excessive use Exercise Habits: Some activity, rec at least 30 mins 5 times a week STD concerns: no risk or activity to increase risk Drug Use: None  Ethan Jones is a patient who I recall quite well over the years.  He has basically really healthy diet with minimal medical problems over time.  He is quite active and works out all the time.  Hep C screen COVID booster  Tdap    Health Maintenance  Topic Date Due   Hepatitis C Screening  Never done   COVID-19 Vaccine (4 - 2024-25 season) 03/31/2023   DTaP/Tdap/Td (2 - Td or Tdap) 05/14/2023   INFLUENZA VACCINE  02/28/2024   Colonoscopy  06/08/2030   HIV Screening  Completed   Zoster Vaccines- Shingrix   Completed   HPV VACCINES  Aged Out   Meningococcal B Vaccine  Aged Out   Immunization History  Administered Date(s) Administered   Influenza,inj,Quad PF,6+ Mos 04/25/2017, 04/28/2018, 03/30/2019, 04/25/2020, 04/24/2021   Influenza,inj,quad, With Preservative 07/11/2015   PFIZER(Purple Top)SARS-COV-2 Vaccination 11/09/2019, 12/01/2019, 08/02/2020   Tdap 05/13/2013   Zoster Recombinant(Shingrix ) 11/14/2022, 02/28/2023   There are no active problems to display for this patient.   No past medical history on file.  Past Surgical History:  Procedure Laterality  Date   COLONOSCOPY WITH PROPOFOL  N/A 06/08/2020   Procedure: COLONOSCOPY WITH PROPOFOL ;  Surgeon: Selena Daily, MD;  Location: [facility] ENDOSCOPY;  Service: Gastroenterology;  Laterality: N/A;    No family history on file.  Social History   Social History Narrative   Not on file    Past Medical History, Surgical History, Social History, Family History, Problem List, Medications, and Allergies have been reviewed and updated if relevant.  Review of Systems: Pertinent positives are listed above.  Otherwise, a full 14 point review of systems has been done in full and it is negative except where it is noted positive.  Objective:   There were no vitals taken for this visit. Ideal Body Weight:    Ideal Body Weight:   No results found.    11/14/2022   10:42 AM 05/17/2021   10:31 AM 04/25/2020    8:47 AM 04/25/2017    8:25 AM  Depression screen PHQ 2/9  Decreased Interest 0 0 0 0  Down, Depressed, Hopeless 0 0 0 0  PHQ - 2 Score 0 0 0 0     GEN: well developed, well nourished, no acute distress Eyes: conjunctiva and lids normal, PERRLA, EOMI ENT: TM clear, nares clear, oral exam WNL Neck: supple, no lymphadenopathy, no thyromegaly, no JVD Pulm: clear to auscultation and percussion, respiratory effort normal CV: regular rate and rhythm, S1-S2, no murmur, rub or gallop, no bruits, peripheral pulses normal and symmetric, no cyanosis, clubbing, edema or varicosities GI: soft, non-tender; no hepatosplenomegaly, masses; active bowel sounds all quadrants GU: deferred Lymph:  no cervical, axillary or inguinal adenopathy MSK: gait normal, muscle tone and strength WNL, no joint swelling, effusions, discoloration, crepitus  SKIN: clear, good turgor, color WNL, no rashes, lesions, or ulcerations Neuro: normal mental status, normal strength, sensation, and motion Psych: alert; oriented to person, place and time, normally interactive and not anxious or depressed in appearance.  All labs  reviewed with patient. Results for orders placed or performed in visit on 11/07/22  PSA, Total with Reflex to PSA, Free   Collection Time: 11/07/22  8:17 AM  Result Value Ref Range   PSA, Total 0.9 < OR = 4.0 ng/mL  Lipid panel   Collection Time: 11/07/22  8:17 AM  Result Value Ref Range   Cholesterol 232 (H) 0 - 200 mg/dL   Triglycerides 161.0 (H) 0.0 - 149.0 mg/dL   HDL 96.04 (L) >54.09 mg/dL   VLDL 81.1 (H) 0.0 - 91.4 mg/dL   Total CHOL/HDL Ratio 7    NonHDL 197.13   Hemoglobin A1c   Collection Time: 11/07/22  8:17 AM  Result Value Ref Range   Hgb A1c MFr Bld 5.6 4.6 - 6.5 %  Hepatic function panel   Collection Time: 11/07/22  8:17 AM  Result Value Ref Range   Total Bilirubin 0.5 0.2 - 1.2 mg/dL   Bilirubin, Direct 0.1 0.0 - 0.3 mg/dL   Alkaline Phosphatase 81 39 - 117 U/L   AST 32 0 - 37 U/L   ALT 49 0 - 53 U/L   Total Protein 6.6 6.0 - 8.3 g/dL   Albumin 4.3 3.5 - 5.2 g/dL  CBC with Differential/Platelet   Collection Time: 11/07/22  8:17 AM  Result Value Ref Range   WBC 7.0 4.0 - 10.5 K/uL   RBC 5.55 4.22 - 5.81 Mil/uL   Hemoglobin 15.9 13.0 - 17.0 g/dL   HCT 78.2 95.6 - 21.3 %   MCV 86.2 78.0 - 100.0 fl   MCHC 33.3 30.0 - 36.0 g/dL   RDW 08.6 57.8 - 46.9 %   Platelets 264.0 150.0 - 400.0 K/uL   Neutrophils Relative % 58.2 43.0 - 77.0 %   Lymphocytes Relative 29.8 12.0 - 46.0 %   Monocytes Relative 7.1 3.0 - 12.0 %   Eosinophils Relative 4.0 0.0 - 5.0 %   Basophils Relative 0.9 0.0 - 3.0 %   Neutro Abs 4.1 1.4 - 7.7 K/uL   Lymphs Abs 2.1 0.7 - 4.0 K/uL   Monocytes Absolute 0.5 0.1 - 1.0 K/uL   Eosinophils Absolute 0.3 0.0 - 0.7 K/uL   Basophils Absolute 0.1 0.0 - 0.1 K/uL  Basic metabolic panel   Collection Time: 11/07/22  8:17 AM  Result Value Ref Range   Sodium 141 135 - 145 mEq/L   Potassium 4.1 3.5 - 5.1 mEq/L   Chloride 104 96 - 112 mEq/L   CO2 29 19 - 32 mEq/L   Glucose, Bld 89 70 - 99 mg/dL   BUN 12 6 - 23 mg/dL   Creatinine, Ser 6.29 0.40 - 1.50  mg/dL   GFR 52.84 >13.24 mL/min   Calcium 9.2 8.4 - 10.5 mg/dL  LDL cholesterol, direct   Collection Time: 11/07/22  8:17 AM  Result Value Ref Range   Direct LDL 139.0 mg/dL    Assessment and Plan:     ICD-10-CM   1. Healthcare maintenance  Z00.00       Health Maintenance Exam: The patient's preventative maintenance and recommended screening tests for an annual wellness exam were reviewed in  full today. Brought up to date unless services declined.  Counselled on the importance of diet, exercise, and its role in overall health and mortality. The patient's FH and SH was reviewed, including their home life, tobacco status, and drug and alcohol status.  Follow-up in 1 year for physical exam or additional follow-up below.  Disposition: No follow-ups on file.  No orders of the defined types were placed in this encounter.  There are no discontinued medications. No orders of the defined types were placed in this encounter.   Signed,  Ranny Bye. Sharika Mosquera, MD   Allergies as of 11/27/2023   No Known Allergies      Medication List        Accurate as of November 26, 2023 12:35 PM. If you have any questions, ask your nurse or doctor.          clindamycin  1 % external solution Commonly known as: CLEOCIN  T Apply topically to neck at night as needed for outbreak.   PARoxetine  20 MG tablet Commonly known as: PAXIL  TAKE 1 TABLET(20 MG) BY MOUTH DAILY

## 2023-11-27 ENCOUNTER — Encounter: Payer: Self-pay | Admitting: Family Medicine

## 2023-11-27 ENCOUNTER — Ambulatory Visit (INDEPENDENT_AMBULATORY_CARE_PROVIDER_SITE_OTHER): Admitting: Family Medicine

## 2023-11-27 VITALS — BP 100/70 | HR 80 | Temp 97.3°F | Ht 74.0 in | Wt 208.5 lb

## 2023-11-27 DIAGNOSIS — Z131 Encounter for screening for diabetes mellitus: Secondary | ICD-10-CM | POA: Diagnosis not present

## 2023-11-27 DIAGNOSIS — Z0000 Encounter for general adult medical examination without abnormal findings: Secondary | ICD-10-CM

## 2023-11-27 DIAGNOSIS — Z13220 Encounter for screening for lipoid disorders: Secondary | ICD-10-CM | POA: Diagnosis not present

## 2023-11-27 DIAGNOSIS — R5383 Other fatigue: Secondary | ICD-10-CM

## 2023-11-27 DIAGNOSIS — Z125 Encounter for screening for malignant neoplasm of prostate: Secondary | ICD-10-CM

## 2023-11-27 DIAGNOSIS — Z1159 Encounter for screening for other viral diseases: Secondary | ICD-10-CM

## 2023-11-27 DIAGNOSIS — Z23 Encounter for immunization: Secondary | ICD-10-CM | POA: Diagnosis not present

## 2023-11-27 LAB — BASIC METABOLIC PANEL WITH GFR
BUN: 21 mg/dL (ref 6–23)
CO2: 29 meq/L (ref 19–32)
Calcium: 9.5 mg/dL (ref 8.4–10.5)
Chloride: 104 meq/L (ref 96–112)
Creatinine, Ser: 1.24 mg/dL (ref 0.40–1.50)
GFR: 67.24 mL/min (ref >60.00)
Glucose, Bld: 90 mg/dL (ref 70–99)
Potassium: 5.2 meq/L — ABNORMAL HIGH (ref 3.5–5.1)
Sodium: 139 meq/L (ref 135–145)

## 2023-11-27 LAB — LIPID PANEL
Cholesterol: 199 mg/dL (ref 0–200)
HDL: 39.7 mg/dL (ref >39.00)
LDL Cholesterol: 116 mg/dL — ABNORMAL HIGH (ref 0–99)
NonHDL: 159.62
Total CHOL/HDL Ratio: 5
Triglycerides: 220 mg/dL — ABNORMAL HIGH (ref 0.0–149.0)
VLDL: 44 mg/dL — ABNORMAL HIGH (ref 0.0–40.0)

## 2023-11-27 LAB — CBC WITH DIFFERENTIAL/PLATELET
Basophils Absolute: 0 10*3/uL (ref 0.0–0.1)
Basophils Relative: 0.7 % (ref 0.0–3.0)
Eosinophils Absolute: 0.2 10*3/uL (ref 0.0–0.7)
Eosinophils Relative: 2.9 % (ref 0.0–5.0)
HCT: 49.4 % (ref 39.0–52.0)
Hemoglobin: 16.4 g/dL (ref 13.0–17.0)
Lymphocytes Relative: 29.6 % (ref 12.0–46.0)
Lymphs Abs: 2 10*3/uL (ref 0.7–4.0)
MCHC: 33.2 g/dL (ref 30.0–36.0)
MCV: 85.9 fl (ref 78.0–100.0)
Monocytes Absolute: 0.4 10*3/uL (ref 0.1–1.0)
Monocytes Relative: 5.6 % (ref 3.0–12.0)
Neutro Abs: 4.2 10*3/uL (ref 1.4–7.7)
Neutrophils Relative %: 61.2 % (ref 43.0–77.0)
Platelets: 255 10*3/uL (ref 150.0–400.0)
RBC: 5.75 Mil/uL (ref 4.22–5.81)
RDW: 13.5 % (ref 11.5–15.5)
WBC: 6.8 10*3/uL (ref 4.0–10.5)

## 2023-11-27 LAB — HEPATIC FUNCTION PANEL
ALT: 29 U/L (ref 0–53)
AST: 19 U/L (ref 0–37)
Albumin: 4.7 g/dL (ref 3.5–5.2)
Alkaline Phosphatase: 72 U/L (ref 39–117)
Bilirubin, Direct: 0.1 mg/dL (ref 0.0–0.3)
Total Bilirubin: 0.5 mg/dL (ref 0.2–1.2)
Total Protein: 7.1 g/dL (ref 6.0–8.3)

## 2023-11-27 LAB — HEMOGLOBIN A1C: Hgb A1c MFr Bld: 5.4 % (ref 4.6–6.5)

## 2023-11-28 ENCOUNTER — Encounter: Payer: Self-pay | Admitting: Family Medicine

## 2023-11-29 LAB — PSA, TOTAL WITH REFLEX TO PSA, FREE: PSA, Total: 0.5 ng/mL (ref <=4.0)

## 2023-11-29 LAB — HEPATITIS C ANTIBODY: Hepatitis C Ab: NONREACTIVE

## 2023-12-02 ENCOUNTER — Other Ambulatory Visit: Payer: Self-pay | Admitting: Family Medicine

## 2023-12-30 ENCOUNTER — Encounter: Payer: Self-pay | Admitting: Family Medicine

## 2023-12-30 MED ORDER — PAROXETINE HCL 20 MG PO TABS: 20.0000 mg | ORAL_TABLET | Freq: Every day | ORAL | 1 refills | Status: DC

## 2024-06-05 ENCOUNTER — Other Ambulatory Visit: Payer: Self-pay | Admitting: Family Medicine

## 2024-07-24 ENCOUNTER — Other Ambulatory Visit: Payer: Self-pay | Admitting: Internal Medicine

## 2024-07-24 MED ORDER — OSELTAMIVIR PHOSPHATE 75 MG PO CAPS: 75.0000 mg | ORAL_CAPSULE | Freq: Two times a day (BID) | ORAL | 0 refills | Status: AC
